# Patient Record
Sex: Male | Born: 1995 | Race: White | Hispanic: No | Marital: Single | Smoking: Never smoker
Health system: Southern US, Community
[De-identification: ages and names within clinical notes are randomized; demographics above are authoritative.]

## PROBLEM LIST (undated history)

## (undated) DIAGNOSIS — F32A Depression, unspecified: Secondary | ICD-10-CM

## (undated) HISTORY — PX: WISDOM TOOTH EXTRACTION: SHX21

## (undated) HISTORY — DX: Depression, unspecified: F32.A

---
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
## 2020-07-29 ENCOUNTER — Telehealth: Payer: Self-pay

## 2020-07-29 NOTE — Telephone Encounter (Signed)
Patient is grandson of mittie and ronald hinshaw and would like to be taken on as new patient Please advise Thank you

## 2020-07-29 NOTE — Telephone Encounter (Signed)
That is fine schedule him.

## 2020-08-03 ENCOUNTER — Encounter (HOSPITAL_COMMUNITY): Payer: Self-pay

## 2020-08-03 ENCOUNTER — Emergency Department (HOSPITAL_COMMUNITY)
Admission: EM | Admit: 2020-08-03 | Discharge: 2020-08-03 | Disposition: A | Discharge status: Home / Self Care | Payer: BC Managed Care – PPO | Attending: Emergency Medicine | Admitting: Emergency Medicine

## 2020-08-03 ENCOUNTER — Emergency Department (HOSPITAL_COMMUNITY): Payer: BC Managed Care – PPO

## 2020-08-03 ENCOUNTER — Other Ambulatory Visit: Payer: Self-pay

## 2020-08-03 DIAGNOSIS — R109 Unspecified abdominal pain: Secondary | ICD-10-CM

## 2020-08-03 DIAGNOSIS — R1033 Periumbilical pain: Secondary | ICD-10-CM | POA: Diagnosis not present

## 2020-08-03 LAB — COMPREHENSIVE METABOLIC PANEL
ALT: 18 U/L (ref 0–44)
AST: 26 U/L (ref 15–41)
Albumin: 4.3 g/dL (ref 3.5–5.0)
Alkaline Phosphatase: 73 U/L (ref 38–126)
Anion gap: 8 (ref 5–15)
BUN: 10 mg/dL (ref 6–20)
CO2: 23 mmol/L (ref 22–32)
Calcium: 9.3 mg/dL (ref 8.9–10.3)
Chloride: 105 mmol/L (ref 98–111)
Creatinine, Ser: 0.9 mg/dL (ref 0.61–1.24)
GFR, Estimated: >60 mL/min (ref >60)
Glucose, Bld: 80 mg/dL (ref 70–99)
Potassium: 5.4 mmol/L — ABNORMAL HIGH (ref 3.5–5.1)
Sodium: 136 mmol/L (ref 135–145)
Total Bilirubin: 1.4 mg/dL — ABNORMAL HIGH (ref 0.3–1.2)
Total Protein: 7.3 g/dL (ref 6.5–8.1)

## 2020-08-03 LAB — CBC WITH DIFFERENTIAL/PLATELET
Abs Immature Granulocytes: 0.02 10*3/uL (ref 0.00–0.07)
Basophils Absolute: 0 10*3/uL (ref 0.0–0.1)
Basophils Relative: 1 %
Eosinophils Absolute: 0.1 10*3/uL (ref 0.0–0.5)
Eosinophils Relative: 1 %
HCT: 49.1 % (ref 39.0–52.0)
Hemoglobin: 16.7 g/dL (ref 13.0–17.0)
Immature Granulocytes: 0 %
Lymphocytes Relative: 21 %
Lymphs Abs: 1.4 10*3/uL (ref 0.7–4.0)
MCH: 30.3 pg (ref 26.0–34.0)
MCHC: 34 g/dL (ref 30.0–36.0)
MCV: 88.9 fL (ref 80.0–100.0)
Monocytes Absolute: 0.4 10*3/uL (ref 0.1–1.0)
Monocytes Relative: 6 %
Neutro Abs: 4.6 10*3/uL (ref 1.7–7.7)
Neutrophils Relative %: 71 %
Platelets: 191 10*3/uL (ref 150–400)
RBC: 5.52 MIL/uL (ref 4.22–5.81)
RDW: 12.9 % (ref 11.5–15.5)
WBC: 6.6 10*3/uL (ref 4.0–10.5)
nRBC: 0 % (ref 0.0–0.2)

## 2020-08-03 LAB — URINALYSIS, ROUTINE W REFLEX MICROSCOPIC
Bilirubin Urine: NEGATIVE
Glucose, UA: NEGATIVE mg/dL
Hgb urine dipstick: NEGATIVE
Ketones, ur: NEGATIVE mg/dL
Leukocytes,Ua: NEGATIVE
Nitrite: NEGATIVE
Protein, ur: NEGATIVE mg/dL
Specific Gravity, Urine: 1.005 (ref 1.005–1.030)
pH: 6 (ref 5.0–8.0)

## 2020-08-03 LAB — LIPASE, BLOOD: Lipase: 30 U/L (ref 11–51)

## 2020-08-03 NOTE — ED Provider Notes (Signed)
[facility]-EMERGENCY DEPT Provider Note   CSN: [id number] Arrival date & time: 08/03/20  1401     History Chief Complaint  Patient presents with  . Abdominal Pain    Jesse Bush is a 25 y.o. male.  25 year old male who presents with abdominal pain and flank pain.  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  Last week, approximately 5 days ago, he noticed a small tender mass under his umbilicus which he thought was a hernia.  He has continued to have intermittent pain in this area but then later developed left flank pain and now the left flank pain has been persistent and more severe.  He cannot get comfortable and has had difficulty sleeping at night due to the pain.  He has had decreased appetite and nausea, no vomiting or diarrhea.  Normal bowel movements.  No urinary symptoms, fevers, or recent illness. No hx of abd surgery.   The history is provided by the patient.  Abdominal Pain      History reviewed. No pertinent past medical history.  There are no problems to display for this patient.   History reviewed. No pertinent surgical history.     History reviewed. No pertinent family history.  Social History   Tobacco Use  . Smoking status: Never Smoker  . Smokeless tobacco: Current User    Home Medications Prior to Admission medications   Not on File    Allergies    Patient has no known allergies.  Review of Systems   Review of Systems  Gastrointestinal: Positive for abdominal pain.   All other systems reviewed and are negative except that which was mentioned in HPI  Physical Exam Updated Vital Signs BP 138/73   Pulse 80   Temp 98.5 F (36.9 C) (Oral)   Resp 20   Ht 5\' 10"  (1.778 m)   Wt 95.3 kg   SpO2 99%   BMI 30.13 kg/m   Physical Exam Constitutional:      General: He is not in acute distress.    Appearance: Normal appearance.  HENT:     Head: Normocephalic and  atraumatic.  Eyes:     Conjunctiva/sclera: Conjunctivae normal.  Cardiovascular:     Rate and Rhythm: Normal rate and regular rhythm.     Heart sounds: Normal heart sounds. No murmur heard.   Pulmonary:     Effort: Pulmonary effort is normal.     Breath sounds: Normal breath sounds.  Abdominal:     General: Abdomen is flat. Bowel sounds are normal. There is no distension.     Palpations: Abdomen is soft.     Tenderness: There is abdominal tenderness.     Comments: Pea-sized mass palpable just below umbilicus, tender to palpation; tenderness in RLQ, no rebound or guarding  Musculoskeletal:     Right lower leg: No edema.     Left lower leg: No edema.  Skin:    General: Skin is warm and dry.  Neurological:     Mental Status: He is alert and oriented to person, place, and time.     Comments: fluent  Psychiatric:        Mood and Affect: Mood normal.        Behavior: Behavior normal.     ED Results / Procedures / Treatments   Labs (all labs ordered are listed, but only abnormal results are displayed) Labs Reviewed  COMPREHENSIVE METABOLIC PANEL - Abnormal; Notable for  the following components:      Result Value   Potassium 5.4 (*)    Total Bilirubin 1.4 (*)    All other components within normal limits  URINALYSIS, ROUTINE W REFLEX MICROSCOPIC - Abnormal; Notable for the following components:   Color, Urine STRAW (*)    All other components within normal limits  CBC WITH DIFFERENTIAL/PLATELET  LIPASE, BLOOD    EKG None  Radiology CT Renal Stone Study  Result Date: 08/03/2020 CLINICAL DATA:  Left flank pain back pain EXAM: CT ABDOMEN AND PELVIS WITHOUT CONTRAST TECHNIQUE: Multidetector CT imaging of the abdomen and pelvis was performed following the standard protocol without IV contrast. COMPARISON:  None. FINDINGS: Lower chest: Lung bases demonstrate no acute consolidation or effusion. Normal cardiac size. Hepatobiliary: No focal liver abnormality is seen. No gallstones,  gallbladder wall thickening, or biliary dilatation. Pancreas: Unremarkable. No pancreatic ductal dilatation or surrounding inflammatory changes. Spleen: Normal in size without focal abnormality. Adrenals/Urinary Tract: Adrenal glands are unremarkable. Kidneys are normal, without renal calculi, focal lesion, or hydronephrosis. Bladder is unremarkable. Stomach/Bowel: Stomach is within normal limits. Appendix appears normal. No evidence of bowel wall thickening, distention, or inflammatory changes. Vascular/Lymphatic: No significant vascular findings are present. No enlarged abdominal or pelvic lymph nodes. Reproductive: Prostate is unremarkable. Other: No abdominal wall hernia or abnormality. No abdominopelvic ascites. Musculoskeletal: No acute or significant osseous findings. IMPRESSION: Negative. No CT evidence for acute intra-abdominal or pelvic abnormality. Electronically Signed   By: Jasmine Pang M.D.   On: 08/03/2020 15:57    Procedures Procedures   Medications Ordered in ED Medications - No data to display  ED Course  I have reviewed the triage vital signs and the nursing notes.  Pertinent labs & imaging results that were available during my care of the patient were reviewed by me and considered in my medical decision making (see chart for details).    MDM Rules/Calculators/A&P                          Pt w/ RLQ tenderness and tenderness just below umbilicus with pea-sized mass, not clearly a hernia but incarcerated hernia on differential, along with kidney stone or appendicitis. Recommended CT for evaluation.   LAbs show normal LFTs and lipase, K 5.4 but suspect hemolysis as creatinine normal and pt not taking any medications. UA normal. Normal CBC.  CT shows no acute findings to explain the patient's symptoms, specifically no renal pathology or abdominal process.  I discussed imaging results with the patient and regarding his back pain recommended trial of NSAID course.  Regarding the  placed on his abdomen, recommended keeping close watch on symptoms and following up with PCP as he may need referral to general surgery if the area becomes enlarged.  No evidence of hernia or mass on CT.  Discussed abortive measures for symptoms and reviewed return precautions.  He voiced understanding. Final Clinical Impression(s) / ED Diagnoses Final diagnoses:  Periumbilical abdominal pain  Left flank pain    Rx / DC Orders ED Discharge Orders    None       Demiana Crumbley, Ambrose Finland, MD 08/03/20 1913

## 2020-08-03 NOTE — ED Notes (Signed)
Called lab to check on CBC.

## 2020-08-03 NOTE — ED Triage Notes (Addendum)
Pt arrived via walk in, c/o abd pain. Believes it is a hernia. Pain radiating to back and flank x3 days. Denies any vomiting or diarrhea, or urinary issues. Endorses nausea.

## 2020-08-04 NOTE — Telephone Encounter (Signed)
LVM for patient to call back to schedule

## 2021-01-13 NOTE — Progress Notes (Signed)
Phone: [phone]   Subjective:  Patient presents today to establish care.  Prior patient of pediatrician.  Chief Complaint  Patient presents with   Establish Care   Fatigue    Pt is c/o fatigue and diarrhea started yesterday morning. He went to [facility] for [facility] test this morning results are not back yet. He was at a party over the weekend.   Joint Pain    See problem oriented charting  The following were reviewed and entered/updated in epic: Past Medical History:  Diagnosis Date   Depression    not clinically diagnosed but has assumed based off of family history- mom and grandad   Patient Active Problem List   Diagnosis Date Noted   Depression 01/17/2021   Past Surgical History:  Procedure Laterality Date   WISDOM TOOTH EXTRACTION     no sedation    Family History  Problem Relation Age of Onset   Depression Mother    Rheum arthritis Mother    Other Father        does not know his medical history   Autism Sister    Depression Maternal Grandfather    Cancer Maternal Grandfather        bladder   Other Paternal Grandmother        covid in 2021   Healthy Paternal Grandfather        age 31 in 2022    Medications- reviewed and updated Current Outpatient Medications  Medication Sig Dispense Refill   ibuprofen (ADVIL) 200 MG tablet Take 400 mg by mouth every 6 (six) hours as needed.     Multiple Vitamin (MULTIVITAMIN) tablet Take 1 tablet by mouth daily.     No current facility-administered medications for this visit.    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       Works at [employer]- part time evening work- meets his financial need   - [employer], [employer] finished first year and [employer] for half semester- music major      Hobbies: not doing music right now, play video games, listen to music      2022-  single not dating/sexual active    Objective  Objective:  BP 110/70 (BP Location: Left Arm, Patient Position: Sitting, Cuff Size: Normal)   Pulse 61   Temp (!) 97.4 F (36.3 C) (Temporal)   Ht 5\' 10"  (1.778 m)   Wt 196 lb 6.1 oz (89.1 kg)   SpO2 98%   BMI 28.18 kg/m  Gen: NAD, resting comfortably HEENT: Mucous membranes are moist. Oropharynx normal. TM normal. Eyes: sclera and lids normal, PERRLA Neck: no thyromegaly, no cervical lymphadenopathy CV: RRR no murmurs rubs or gallops Lungs: CTAB no crackles, wheeze, rhonchi Abdomen: soft/nontender except for mild pain with palpation of lower abdomen (makes him feel like needs BM) /nondistended/normal bowel sounds. No rebound or guarding.  Ext: no edema Skin: warm, dry Neuro: 5/5 strength in upper and lower extremities, normal gait, normal reflexes Small papule or pustule just below glans of penis about 1 mm- 2 even smaller similar lesions just adjacent   Assessment and Plan:   #Fatigue/diarrhea/vomiting S: Patient reports fatigue and diarrhea that started yesterday morning. Vomiting x5-6 since yesterday morning. No blood or black in emesis or in stool. Last episode was 10 or 11 am yesterday. In last 24 hours 10  stools.  He just [facility]-pending results.  He did party over the weekend and is wondering if could be hung over. Trying to drink a lot of water to keep up with losses. Has spent a lot of time in bed. Halloween theme on Sunday night.  - no ibuprofen in last 48 hours- advised to stay off.  - no fever. Mildly winded at times. Not coughing.  A/P: We discussed for fatigue/diarrhea/vomiting if symptoms fail to resolve within 72 hours I would recommend stool sample/testing.  Could consider short-term and if in the next 24 hours does not begin to have significant reduction in diarrhea.  He should stay well-hydrated-discussed mixing perhaps 1 water than 1 Gatorade to help with electrolytes as well.  Would  recommend a brat type diet  #Joint pain S:patient with joint pain since early teenage years. Sometimes with standing has to support himself with his arms. Can be hard working at [employer]- apparently several other coworkers have joint pain as well due to type of work. Has been at [employer] for a year.   Wrists are most painful joint. Knees sometimes as well. Elbows and neck also particularly bothersome.    Joint stiffness in AM- under 5 minutes though- pops back and arms and feels better.  A/P: Offered referral to sports medicine or orthopedics but he would like to hold off for now- will reach out if changes mind     # Depression S: Medication:none  - feels like sleep scheudle affects him- stays up late to 2-3 AM and not tired. Wakes up late and feels like doesn't get much done in  Depression screen PHQ 2/9 01/17/2021  Decreased Interest 3  Down, Depressed, Hopeless 1  PHQ - 2 Score 4  Altered sleeping 3  Tired, decreased energy 2  Change in appetite 1  Feeling bad or failure about yourself  0  Trouble concentrating 0  Moving slowly or fidgety/restless 0  Suicidal thoughts 0  PHQ-9 Score 10  Difficult doing work/chores Somewhat difficult   A/P: Mild to moderate depression based off of PHQ-9 scores-he is open to considering therapy-gave information about [facility].  I also think altering bed schedule/wake-up time and getting involved in the community or starting exercising would be beneficial.   # ED visit for abdominal pain  S:Patient was seen on 08/03/20 for an evaluation of abdominal pain and flank pain. The week before visit, he noticed a small tender mass under his umbilicus which he thought was a hernia. He continued to have intermittent pain in this area however later left flank pain developed and after flank pain was more persistent and more severe. He couldn't get comfortable and had difficulty sleeping at night due to the pain. He decreased appetite and nausea, no vomiting  or diarrhea. Normal bowel movements. No hx of abdominal surgery.  -LAbs showed normal LFTs and lipase, K 5.4 but suspect hemolysis as creatinine normal and pt not taking any medications. UA normal. Normal CBC.  CT showed no acute findings to explain the patient's symptoms, specifically no renal pathology or abdominal process.  I discussed imaging results with the patient and regarding his back pain recommended trial of NSAID course.  - patient felt smal bulge under umbilicus- has gotten smaller since that time A/P: could have mild fat containing hernia - luckly Ct was encouraging and symptoms better- continue to monitor   #Unprotected sex-patient has had unprotected sex in the past and not been tested  for STDs-he is willing to have [place] check him-due to recent COVID test we will need to have him come back for this-can schedule lab visit so future date -has lesion at base of penis he thinks could be PPP but this occurred PRIOR to sexual intercourse so not thought to be STDsince this occurred prior to intercourse and present for 6-8 years without change ok to monitor- would use protection with sex to be on safe side- if area worsens should let us know.  Also he had Gardasil before this appeared making genital warts less likely.  Also appears to be more of a papule  or pustule - not verrucous. Doubt penile cancer with no change over years   Recommended follow up: Return in about 1 year (around 01/17/2022) for physical or sooner if needed.  Time Spent: 47 minutes of total time (11:03 AM- 11:50 AM) was spent on the date of the encounter performing the following actions: chart review prior to seeing the patient, obtaining history and entering into epic, performing a medically necessary exam, counseling on the treatment plan, placing orders, and documenting in our EHR.   I,Jada Bradford,acting as a scribe for Tana Conch, MD.,have documented all relevant documentation on the behalf of Tana Conch, MD,as  directed by  Tana Conch, MD while in the presence of Tana Conch, MD.  I, Tana Conch, MD, have reviewed all documentation for this visit. The documentation on 01/17/21 for the exam, diagnosis, procedures, and orders are all accurate and complete.  Return precautions advised. Tana Conch, MD

## 2021-01-17 ENCOUNTER — Encounter: Payer: Self-pay | Admitting: Family Medicine

## 2021-01-17 ENCOUNTER — Ambulatory Visit (INDEPENDENT_AMBULATORY_CARE_PROVIDER_SITE_OTHER): Payer: BC Managed Care – PPO | Admitting: Family Medicine

## 2021-01-17 ENCOUNTER — Other Ambulatory Visit: Payer: Self-pay

## 2021-01-17 VITALS — BP 110/70 | HR 61 | Temp 97.4°F | Ht 70.0 in | Wt 196.4 lb

## 2021-01-17 DIAGNOSIS — Z1159 Encounter for screening for other viral diseases: Secondary | ICD-10-CM | POA: Diagnosis not present

## 2021-01-17 DIAGNOSIS — M2550 Pain in unspecified joint: Secondary | ICD-10-CM

## 2021-01-17 DIAGNOSIS — R5383 Other fatigue: Secondary | ICD-10-CM

## 2021-01-17 DIAGNOSIS — Z113 Encounter for screening for infections with a predominantly sexual mode of transmission: Secondary | ICD-10-CM

## 2021-01-17 DIAGNOSIS — F32A Depression, unspecified: Secondary | ICD-10-CM

## 2021-01-17 DIAGNOSIS — Z118 Encounter for screening for other infectious and parasitic diseases: Secondary | ICD-10-CM

## 2021-01-17 DIAGNOSIS — Z13220 Encounter for screening for lipoid disorders: Secondary | ICD-10-CM

## 2021-01-17 DIAGNOSIS — Z114 Encounter for screening for human immunodeficiency virus [HIV]: Secondary | ICD-10-CM

## 2021-01-17 NOTE — Patient Instructions (Addendum)
Health Maintenance Due  Topic Date Due   HPV VACCINES (1 - Male 2-dose series)- team please check [facility]  Never done   COVID-19 Vaccine - consider bivalent vaccination 11/08/2019   We discussed for fatigue/diarrhea/vomiting if symptoms fail to resolve within 72 hours I would recommend stool sample/testing.  Could consider short-term and if in the next 24 hours does not begin to have significant reduction in diarrhea.  He should stay well-hydrated-discussed mixing perhaps 1 water than 1 Gatorade to help with electrolytes as well.  Would recommend a brat type diet - needs to have negative covid test and be diarrhea free for 24 hours  Please call [phone] to schedule a visit with [facility] - please tell the office you were directly referred by Dr. Durene Cal  Schedule lab visit AFTER covid test negative and after diarrhea and vomiting gone for 24 hours  Team you can send him out the side door  Recommended follow up: Return in about 1 year (around 01/17/2022) for physical or sooner if needed.

## 2021-02-21 ENCOUNTER — Other Ambulatory Visit: Payer: Self-pay | Admitting: Family Medicine

## 2021-02-21 ENCOUNTER — Other Ambulatory Visit: Payer: Self-pay

## 2021-02-21 ENCOUNTER — Ambulatory Visit: Payer: Self-pay

## 2021-02-21 DIAGNOSIS — R0689 Other abnormalities of breathing: Secondary | ICD-10-CM

## 2022-02-06 ENCOUNTER — Encounter: Payer: BC Managed Care – PPO | Admitting: Family Medicine

## 2022-03-16 ENCOUNTER — Emergency Department (HOSPITAL_COMMUNITY)
Admission: EM | Admit: 2022-03-16 | Discharge: 2022-03-17 | Disposition: A | Discharge status: Home / Self Care | Payer: BC Managed Care – PPO | Attending: Student | Admitting: Student

## 2022-03-16 ENCOUNTER — Other Ambulatory Visit: Payer: Self-pay

## 2022-03-16 ENCOUNTER — Emergency Department (HOSPITAL_COMMUNITY): Payer: BC Managed Care – PPO

## 2022-03-16 ENCOUNTER — Encounter (HOSPITAL_COMMUNITY): Payer: Self-pay | Admitting: *Deleted

## 2022-03-16 DIAGNOSIS — R0602 Shortness of breath: Secondary | ICD-10-CM | POA: Diagnosis present

## 2022-03-16 LAB — CBC
HCT: 45.4 % (ref 39.0–52.0)
Hemoglobin: 16.4 g/dL (ref 13.0–17.0)
MCH: 31.1 pg (ref 26.0–34.0)
MCHC: 36.1 g/dL — ABNORMAL HIGH (ref 30.0–36.0)
MCV: 86.1 fL (ref 80.0–100.0)
Platelets: 338 10*3/uL (ref 150–400)
RBC: 5.27 MIL/uL (ref 4.22–5.81)
RDW: 12.1 % (ref 11.5–15.5)
WBC: 8.7 10*3/uL (ref 4.0–10.5)
nRBC: 0 % (ref 0.0–0.2)

## 2022-03-16 LAB — BASIC METABOLIC PANEL
Anion gap: 9 (ref 5–15)
BUN: 7 mg/dL (ref 6–20)
CO2: 22 mmol/L (ref 22–32)
Calcium: 9.3 mg/dL (ref 8.9–10.3)
Chloride: 106 mmol/L (ref 98–111)
Creatinine, Ser: 0.88 mg/dL (ref 0.61–1.24)
GFR, Estimated: >60 mL/min (ref >60)
Glucose, Bld: 102 mg/dL — ABNORMAL HIGH (ref 70–99)
Potassium: 3.5 mmol/L (ref 3.5–5.1)
Sodium: 137 mmol/L (ref 135–145)

## 2022-03-16 LAB — TROPONIN I (HIGH SENSITIVITY)
Troponin I (High Sensitivity): 3 ng/L (ref <18)
Troponin I (High Sensitivity): 3 ng/L (ref <18)

## 2022-03-16 NOTE — ED Notes (Signed)
Pt hyperventilating in lobby, told by pt visitor and other pt. PA and RN made aware, vitals taken.

## 2022-03-16 NOTE — ED Provider Triage Note (Signed)
Emergency Medicine Provider Triage Evaluation Note  Jesse Bush , a 26 y.o. male  was evaluated in triage.  Pt complains of chest pain and shortness of breath. States that same began a few hours ago when he was at work and has been persistent since then.  He states that he is unable to take a deep breath.  Denies any history of similar symptoms previously.  Denies any cough or congestion.  Denies any cardiac history.  Denies any history of cocaine or methamphetamine abuse.  Patient does vape.  Review of Systems  Positive: Negative:   Physical Exam  BP (!) 141/98 (BP Location: Right Arm)   Pulse 76   Temp 99.1 F (37.3 C)   Resp 16   Ht 5\' 10"  (1.778 m)   Wt 98.1 kg   SpO2 97%   BMI 31.03 kg/m  Gen:   Awake, no distress   Resp:  Normal effort  MSK:   Moves extremities without difficulty  Other:    Medical Decision Making  Medically screening exam initiated at 6:29 PM.  Appropriate orders placed.  Jesse Bush was informed that the remainder of the evaluation will be completed by another provider, this initial triage assessment does not replace that evaluation, and the importance of remaining in the ED until their evaluation is complete.     Alfonse Spruce, PA-C 03/16/22 1831

## 2022-03-16 NOTE — ED Triage Notes (Signed)
The pt is c/o sob and chest pain he was hyperventilating while walking across the room  he reports that he has mild anxiety no acute distress

## 2022-03-17 NOTE — ED Notes (Signed)
Patient walked out of ER at this time prior to discharge. MD notified

## 2022-03-17 NOTE — ED Provider Notes (Signed)
[facility] EMERGENCY DEPARTMENT Provider Note   CSN: [id number] Arrival date & time: 03/16/22  1804     History  Chief Complaint  Patient presents with   Shortness of Breath    Jesse Bush is a 26 y.o. male.  26 year old male presents to the emergency department for evaluation of shortness of breath.  He states that he was at work 1 year ago when chemical spilled on the floor and he inhaled the fumes.  Since this time, he has had sporadic episodes of shortness of breath which are associated with progression to full "body numbness" as well as lightheadedness and near syncope.  He has had a left-sided chest discomfort today which is worse when applying pressure to his left chest wall.  Notes some hemoptysis mixed with phlegm on occasion. He has been to an urgent care as well as a primary care doctor for the symptoms without formal diagnosis.  Denies fever, cough, congestion, history of ACS.  No known family history of sudden cardiac death at a young age.  He has tried to decrease his caffeine intake without symptomatic relief.  Does continue to vape.  Denies illicit drug or stimulant use.  No recent surgeries or hospitalizations, prolonged travel, known PMH or FHx of VTE.  The history is provided by the patient. No language interpreter was used.  Shortness of Breath      Home Medications Prior to Admission medications   Medication Sig Start Date End Date Taking? Authorizing Provider  ibuprofen (ADVIL) 200 MG tablet Take 400 mg by mouth every 6 (six) hours as needed.    [provider]  Multiple Vitamin (MULTIVITAMIN) tablet Take 1 tablet by mouth daily.    [provider]      Allergies    Patient has no known allergies.    Review of Systems   Review of Systems  Respiratory:  Positive for shortness of breath.   Ten systems reviewed and are negative for acute change, except as noted in the HPI.    Physical Exam Updated Vital Signs BP  133/80   Pulse (!) 56   Temp 98.3 F (36.8 C) (Oral)   Resp 18   Ht 5\' 10"  (1.778 m)   Wt 98.1 kg   SpO2 100%   BMI 31.03 kg/m   Physical Exam Vitals and nursing note reviewed.  Constitutional:      General: He is not in acute distress.    Appearance: He is well-developed. He is not diaphoretic.     Comments: Nontoxic appearing and in NAD  HENT:     Head: Normocephalic and atraumatic.  Eyes:     General: No scleral icterus.    Conjunctiva/sclera: Conjunctivae normal.  Cardiovascular:     Rate and Rhythm: Normal rate and regular rhythm.     Pulses: Normal pulses.  Pulmonary:     Effort: Pulmonary effort is normal. No respiratory distress.     Breath sounds: No stridor. No wheezing or rales.     Comments: Lungs CTAB. Respirations even and unlabored. Musculoskeletal:        General: Normal range of motion.     Cervical back: Normal range of motion.  Skin:    General: Skin is warm and dry.     Coloration: Skin is not pale.     Findings: No erythema or rash.  Neurological:     Mental Status: He is alert and oriented to person, place, and time.     Coordination:  Coordination normal.  Psychiatric:        Behavior: Behavior normal.     ED Results / Procedures / Treatments   Labs (all labs ordered are listed, but only abnormal results are displayed) Labs Reviewed  BASIC METABOLIC PANEL - Abnormal; Notable for the following components:      Result Value   Glucose, Bld 102 (*)    All other components within normal limits  CBC - Abnormal; Notable for the following components:   MCHC 36.1 (*)    All other components within normal limits  TROPONIN I (HIGH SENSITIVITY)  TROPONIN I (HIGH SENSITIVITY)    EKG None  Radiology DG Chest 2 View  Result Date: 03/16/2022 CLINICAL DATA:  Chest pain EXAM: CHEST - 2 VIEW COMPARISON:  02/21/2021 FINDINGS: The heart size and mediastinal contours are within normal limits. Both lungs are clear. Lateral view is less than optimal due  to motion artifacts. The visualized skeletal structures are unremarkable. IMPRESSION: No active cardiopulmonary disease. Electronically Signed   By: Ernie Avena M.D.   On: 03/16/2022 19:29    Procedures Procedures    Medications Ordered in ED Medications - No data to display  ED Course/ Medical Decision Making/ A&P Clinical Course as of 03/17/22 0103  Sat Mar 17, 2022  0037 Notified by RN that patient eloped from the ED. [KH]    Clinical Course User Index [KH] Antony Madura, PA-C                           Medical Decision Making Amount and/or Complexity of Data Reviewed Labs: ordered.   This patient presents to the ED for concern of shortness of breath, this involves an extensive number of treatment options, and is a complaint that carries with it a high risk of complications and morbidity.  The differential diagnosis includes ACS vs myocarditis vs pleural effusion vs PTX vs PNA vs anxiety attack   Co morbidities that complicate the patient evaluation  Depression Anxiety    Additional history obtained:  Additional history obtained from family at bedside External records from outside source obtained and reviewed including prior CXR in December 2022 which was negative for acute process.   Lab Tests:  I Ordered, and personally interpreted labs.  The pertinent results include:  Negative Troponin x 2. No electrolyte derangements. Normal CBC and BMP.   Imaging Studies ordered:  I ordered imaging studies including CXR  I independently visualized and interpreted imaging which showed no acute cardiopulmonary abnormality I agree with the radiologist interpretation   Cardiac Monitoring:  The patient was maintained on a cardiac monitor.  I personally viewed and interpreted the cardiac monitored which showed an underlying rhythm of: NSR   Medicines ordered and prescription drug management:  I have reviewed the patients home medicines and have made adjustments as  needed   Test Considered:  D dimer - patient eloped prior to completion   Problem List / ED Course:  Low suspicion for emergent cardiac etiology given reassuring workup today and symptom chronicity.  EKG is nonischemic and troponin negative x2.   Chest x-ray without evidence of mediastinal widening to suggest dissection.  No pneumothorax, pneumonia, pleural effusion.   Pulmonary embolus further considered; however, patient without tachycardia, tachypnea, dyspnea, hypoxia. Notified by RN that patient eloped from the department prior to D dimer completion.   Social Determinants of Health:  Insured patient   Dispostion:  Disposition set to eloped. Patient seen departing  the ED in stable condition.         Final Clinical Impression(s) / ED Diagnoses Final diagnoses:  SOB (shortness of breath)    Rx / DC Orders ED Discharge Orders     None         Antony Madura, PA-C 03/17/22 0105    Glendora Score, MD 03/17/22 [phone]

## 2022-03-19 ENCOUNTER — Observation Stay (HOSPITAL_BASED_OUTPATIENT_CLINIC_OR_DEPARTMENT_OTHER)
Admission: EM | Admit: 2022-03-19 | Discharge: 2022-03-20 | Disposition: A | Discharge status: Home / Self Care | Payer: BC Managed Care – PPO | Attending: Surgery | Admitting: Surgery

## 2022-03-19 ENCOUNTER — Other Ambulatory Visit: Payer: Self-pay

## 2022-03-19 ENCOUNTER — Encounter (HOSPITAL_BASED_OUTPATIENT_CLINIC_OR_DEPARTMENT_OTHER): Payer: Self-pay | Admitting: *Deleted

## 2022-03-19 ENCOUNTER — Emergency Department (HOSPITAL_BASED_OUTPATIENT_CLINIC_OR_DEPARTMENT_OTHER): Payer: BC Managed Care – PPO

## 2022-03-19 DIAGNOSIS — R109 Unspecified abdominal pain: Secondary | ICD-10-CM | POA: Diagnosis present

## 2022-03-19 DIAGNOSIS — R1084 Generalized abdominal pain: Principal | ICD-10-CM

## 2022-03-19 DIAGNOSIS — K3580 Unspecified acute appendicitis: Secondary | ICD-10-CM | POA: Diagnosis not present

## 2022-03-19 LAB — COMPREHENSIVE METABOLIC PANEL
ALT: 29 U/L (ref 0–44)
AST: 22 U/L (ref 15–41)
Albumin: 5.1 g/dL — ABNORMAL HIGH (ref 3.5–5.0)
Alkaline Phosphatase: 76 U/L (ref 38–126)
Anion gap: 14 (ref 5–15)
BUN: 8 mg/dL (ref 6–20)
CO2: 24 mmol/L (ref 22–32)
Calcium: 10 mg/dL (ref 8.9–10.3)
Chloride: 101 mmol/L (ref 98–111)
Creatinine, Ser: 0.87 mg/dL (ref 0.61–1.24)
GFR, Estimated: >60 mL/min (ref >60)
Glucose, Bld: 79 mg/dL (ref 70–99)
Potassium: 3.9 mmol/L (ref 3.5–5.1)
Sodium: 139 mmol/L (ref 135–145)
Total Bilirubin: 1.3 mg/dL — ABNORMAL HIGH (ref 0.3–1.2)
Total Protein: 8.5 g/dL — ABNORMAL HIGH (ref 6.5–8.1)

## 2022-03-19 LAB — CBC
HCT: 49.8 % (ref 39.0–52.0)
HCT: 47.7 % (ref 39.0–52.0)
Hemoglobin: 17.4 g/dL — ABNORMAL HIGH (ref 13.0–17.0)
Hemoglobin: 16 g/dL (ref 13.0–17.0)
MCH: 30.1 pg (ref 26.0–34.0)
MCH: 31.1 pg (ref 26.0–34.0)
MCHC: 34.9 g/dL (ref 30.0–36.0)
MCV: 86.2 fL (ref 80.0–100.0)
Platelets: 376 10*3/uL (ref 150–400)
RBC: 5.78 MIL/uL (ref 4.22–5.81)
RBC: 5.14 MIL/uL (ref 4.22–5.81)
RDW: 12.2 % (ref 11.5–15.5)
WBC: 8.1 10*3/uL (ref 4.0–10.5)
nRBC: 0 % (ref 0.0–0.2)
nRBC: 0 % (ref 0.0–0.2)

## 2022-03-19 LAB — URINALYSIS, ROUTINE W REFLEX MICROSCOPIC
Bacteria, UA: NONE SEEN
Bilirubin Urine: NEGATIVE
Glucose, UA: NEGATIVE mg/dL
Ketones, ur: 40 mg/dL — AB
Leukocytes,Ua: NEGATIVE
Nitrite: NEGATIVE
Protein, ur: NEGATIVE mg/dL
Specific Gravity, Urine: 1.011 (ref 1.005–1.030)
pH: 6 (ref 5.0–8.0)

## 2022-03-19 LAB — HIV ANTIBODY (ROUTINE TESTING W REFLEX): HIV Screen 4th Generation wRfx: NONREACTIVE

## 2022-03-19 LAB — LIPASE, BLOOD: Lipase: 16 U/L (ref 11–51)

## 2022-03-19 MED ORDER — OXYCODONE HCL 5 MG PO TABS: 5.0000 mg | ORAL_TABLET | ORAL | Status: DC | PRN

## 2022-03-19 MED ORDER — DIPHENHYDRAMINE HCL 25 MG PO CAPS: 25.0000 mg | ORAL_CAPSULE | Freq: Four times a day (QID) | ORAL | Status: DC | PRN

## 2022-03-19 MED ORDER — LACTATED RINGERS IV BOLUS
1000.0000 mL | Freq: Once | INTRAVENOUS | Status: AC
  Administered 2022-03-19: 1000 mL via INTRAVENOUS

## 2022-03-19 MED ORDER — SODIUM CHLORIDE 0.9 % IV SOLN
2.0000 g | INTRAVENOUS | Status: DC
  Administered 2022-03-19: 2 g via INTRAVENOUS
  Filled 2022-03-19: qty 20

## 2022-03-19 MED ORDER — ACETAMINOPHEN 650 MG RE SUPP: 650.0000 mg | Freq: Four times a day (QID) | RECTAL | Status: DC | PRN

## 2022-03-19 MED ORDER — MORPHINE SULFATE (PF) 2 MG/ML IV SOLN
2.0000 mg | INTRAVENOUS | Status: DC | PRN
  Administered 2022-03-20: 2 mg via INTRAVENOUS
  Filled 2022-03-19: qty 1

## 2022-03-19 MED ORDER — ACETAMINOPHEN 325 MG PO TABS: 650.0000 mg | ORAL_TABLET | Freq: Four times a day (QID) | ORAL | Status: DC | PRN

## 2022-03-19 MED ORDER — FAMOTIDINE 20 MG PO TABS
20.0000 mg | ORAL_TABLET | Freq: Once | ORAL | Status: AC
  Administered 2022-03-19: 20 mg via ORAL
  Filled 2022-03-19: qty 1

## 2022-03-19 MED ORDER — DIPHENHYDRAMINE HCL 50 MG/ML IJ SOLN: 25.0000 mg | Freq: Four times a day (QID) | INTRAMUSCULAR | Status: DC | PRN

## 2022-03-19 MED ORDER — ENOXAPARIN SODIUM 40 MG/0.4ML IJ SOSY
40.0000 mg | PREFILLED_SYRINGE | INTRAMUSCULAR | Status: DC
  Administered 2022-03-19: 40 mg via SUBCUTANEOUS
  Filled 2022-03-19: qty 0.4

## 2022-03-19 MED ORDER — ONDANSETRON 4 MG PO TBDP: 4.0000 mg | ORAL_TABLET | Freq: Four times a day (QID) | ORAL | Status: DC | PRN

## 2022-03-19 MED ORDER — ONDANSETRON HCL 4 MG/2ML IJ SOLN: 4.0000 mg | Freq: Four times a day (QID) | INTRAMUSCULAR | Status: DC | PRN

## 2022-03-19 MED ORDER — DEXTROSE-NACL 5-0.45 % IV SOLN: INTRAVENOUS | Status: DC

## 2022-03-19 MED ORDER — METRONIDAZOLE 500 MG/100ML IV SOLN
500.0000 mg | Freq: Two times a day (BID) | INTRAVENOUS | Status: DC
  Administered 2022-03-20 (×2): 500 mg via INTRAVENOUS
  Filled 2022-03-19 (×2): qty 100

## 2022-03-19 MED ORDER — METOPROLOL TARTRATE 5 MG/5ML IV SOLN: 5.0000 mg | Freq: Four times a day (QID) | INTRAVENOUS | Status: DC | PRN

## 2022-03-19 MED ORDER — IOHEXOL 300 MG/ML  SOLN
100.0000 mL | Freq: Once | INTRAMUSCULAR | Status: AC | PRN
  Administered 2022-03-19: 100 mL via INTRAVENOUS

## 2022-03-19 MED ORDER — ONDANSETRON HCL 4 MG/2ML IJ SOLN
4.0000 mg | Freq: Once | INTRAMUSCULAR | Status: AC
  Administered 2022-03-19: 4 mg via INTRAVENOUS
  Filled 2022-03-19: qty 2

## 2022-03-19 NOTE — ED Triage Notes (Signed)
Pt is here for left upper abdominal pain x1 week and it is intermittent.  Pt has had nausea and vomiting with this.  Pt has also had diarrhea.  No fever.

## 2022-03-19 NOTE — ED Notes (Signed)
Report given to Charge RN at [facility] ED.

## 2022-03-19 NOTE — ED Provider Notes (Signed)
[facility] EMERGENCY DEPT Provider Note   CSN: [id number] Arrival date & time: 03/19/22  1259     History Chief Complaint  Patient presents with   Abdominal Pain    HPI Jesse Bush is a 27 y.o. male presenting for abdominal pain.  Initially, he reported 2 months of intermittent abdominal pain but states that over the last 48 hours it has gotten acutely worsened.  It is diffuse in nature.  He states that it started periumbilically but now seems to radiate to his left lower quadrant. Endorses subjective fevers and chills, intermittent diarrhea, anorexia over the last 24 hours with no p.o. intake. Patient's recorded medical, surgical, social, medication list and allergies were reviewed in the Snapshot window as part of the initial history.   Review of Systems   Review of Systems  Constitutional:  Negative for chills and fever.  HENT:  Negative for ear pain and sore throat.   Eyes:  Negative for pain and visual disturbance.  Respiratory:  Negative for cough and shortness of breath.   Cardiovascular:  Negative for chest pain and palpitations.  Gastrointestinal:  Positive for abdominal pain. Negative for vomiting.  Genitourinary:  Negative for dysuria and hematuria.  Musculoskeletal:  Negative for arthralgias and back pain.  Skin:  Negative for color change and rash.  Neurological:  Negative for seizures and syncope.  All other systems reviewed and are negative.   Physical Exam Updated Vital Signs BP 131/78 (BP Location: Right Arm)   Pulse 82   Temp 98.4 F (36.9 C)   Resp 16   Wt 100.7 kg   SpO2 100%   BMI 31.84 kg/m  Physical Exam Vitals and nursing note reviewed.  Constitutional:      General: He is not in acute distress.    Appearance: He is well-developed.  HENT:     Head: Normocephalic and atraumatic.  Eyes:     Conjunctiva/sclera: Conjunctivae normal.  Cardiovascular:     Rate and Rhythm: Normal rate and regular rhythm.     Heart sounds: No  murmur heard. Pulmonary:     Effort: Pulmonary effort is normal. No respiratory distress.     Breath sounds: Normal breath sounds.  Abdominal:     Palpations: Abdomen is soft.     Tenderness: There is generalized abdominal tenderness and tenderness in the epigastric area. There is no right CVA tenderness or left CVA tenderness.  Musculoskeletal:        General: No swelling.     Cervical back: Neck supple.  Skin:    General: Skin is warm and dry.     Capillary Refill: Capillary refill takes less than 2 seconds.  Neurological:     Mental Status: He is alert.  Psychiatric:        Mood and Affect: Mood normal.      ED Course/ Medical Decision Making/ A&P    Procedures Procedures   Medications Ordered in ED Medications  ondansetron (ZOFRAN) injection 4 mg (4 mg Intravenous Given 03/19/22 1554)  lactated ringers bolus 1,000 mL (1,000 mLs Intravenous New Bag/Given 03/19/22 1645)  famotidine (PEPCID) tablet 20 mg (20 mg Oral Given 03/19/22 1553)  iohexol (OMNIPAQUE) 300 MG/ML solution 100 mL (100 mLs Intravenous Contrast Given 03/19/22 1605)   Medical Decision Making:   Jesse Bush is a 27 y.o. male who presented to the ED today with abdominal pain, detailed above.    Patient's presentation is complicated by their history of RUQ pain.  Patient placed on continuous  vitals and telemetry monitoring while in ED which was reviewed periodically.  Complete initial physical exam performed, notably the patient  was HDS in NAD.     Reviewed and confirmed nursing documentation for past medical history, family history, social history.    Initial Assessment:   With the patient's presentation of abdominal pain, most likely diagnosis is nonspecific vs MSK etiology. Other diagnoses were considered including (but not limited to) gastroenteritis, colitis, small bowel obstruction, appendicitis, cholecystitis, pancreatitis, nephrolithiasis, UTI, pyleonephritis, testicular  torsion. These are considered  less likely due to history of present illness and physical exam findings.   This is most consistent with an acute life/limb threatening illness complicated by underlying chronic conditions.   Initial Plan:  CBC/CMP to evaluate for underlying infectious/metabolic etiology for patient's abdominal pain  Lipase to evaluate for pancreatitis  EKG to evaluate for cardiac source of pain  CTAB/Pelvis with contrast to evaluate for structural/surgical etiology of patients' severe abdominal pain.  Urinalysis and repeat physical assessment to evaluate for UTI/Pyelonpehritis  Empiric management of symptoms with escalating pain control and antiemetics as needed.   Initial Study Results:   Laboratory  All laboratory results reviewed without evidence of clinically relevant pathology.    Radiology All images reviewed independently. Agree with radiology report at this time.   CT ABDOMEN PELVIS W CONTRAST  Result Date: 03/19/2022 CLINICAL DATA:  Intermittent left upper abdominal pain x1 week. EXAM: CT ABDOMEN AND PELVIS WITH CONTRAST TECHNIQUE: Multidetector CT imaging of the abdomen and pelvis was performed using the standard protocol following bolus administration of intravenous contrast. RADIATION DOSE REDUCTION: This exam was performed according to the departmental dose-optimization program which includes automated exposure control, adjustment of the mA and/or kV according to patient size and/or use of iterative reconstruction technique. CONTRAST:  OMNIPAQUE IOHEXOL 300 MG/ML  SOLN COMPARISON:  CT Aug 03, 2020 FINDINGS: Lower chest: No acute abnormality. Hepatobiliary: No suspicious hepatic lesion. Gallbladder is unremarkable. No biliary ductal dilation. Pancreas: No pancreatic ductal dilation or evidence of acute inflammation. Spleen: No splenomegaly. Adrenals/Urinary Tract: Bilateral adrenal glands appear normal. No hydronephrosis. Kidneys demonstrate symmetric enhancement. Mild wall thickening of an  incompletely distended urinary bladder. Stomach/Bowel: Stomach is unremarkable for degree of distension. No pathologic dilation of small or large bowel. Prominent fluid-filled appendix measures 7 mm in diameter on image 73/2. Colon is predominately decompressed limiting evaluation. Vascular/Lymphatic: Normal caliber abdominal aorta. No pathologically enlarged abdominal or pelvic lymph nodes. Reproductive: Prostate is unremarkable. Other: No significant abdominopelvic free fluid. Musculoskeletal: No acute or significant osseous findings. IMPRESSION: 1. Prominent fluid-filled appendix measures 7 mm in diameter. Findings are equivocal for early acute appendicitis. 2. Mild wall thickening of an incompletely distended urinary bladder. Correlate with urinalysis to exclude cystitis. Electronically Signed   By: Maudry Mayhew M.D.   On: 03/19/2022 16:30   DG Chest 2 View  Result Date: 03/16/2022 CLINICAL DATA:  Chest pain EXAM: CHEST - 2 VIEW COMPARISON:  02/21/2021 FINDINGS: The heart size and mediastinal contours are within normal limits. Both lungs are clear. Lateral view is less than optimal due to motion artifacts. The visualized skeletal structures are unremarkable. IMPRESSION: No active cardiopulmonary disease. Electronically Signed   By: Ernie Avena M.D.   On: 03/16/2022 19:29     Consults: Case discussed with Dr. Sheliah Hatch of general surgery and Dr. Durwin Nora of emergency medicine.   Final Reassessment and Plan:   Patient's history of present on this and physical exam findings remain nonspecific at this time.  CT scan with possible early appendicitis and physical exam with severe abdominal pain. History is concerning with his anorexia.  However, still not fully diagnosed at this time.  Telephone consultation with Dr. Kieth Brightly who recommended in person evaluation by surgery.  Patient to transfer to [facility] accepted by Dr. Doren Custard to the emergency room to be evaluated by general  surgery.     Clinical Impression:  1. Generalized abdominal pain      Transfer via POV   Final Clinical Impression(s) / ED Diagnoses Final diagnoses:  Generalized abdominal pain    Rx / DC Orders ED Discharge Orders     None         Tretha Sciara, MD 03/19/22 1719

## 2022-03-19 NOTE — H&P (Signed)
Reason for Consult:abdominal pain Referring Provider: Tretha Sciara  Jesse Bush is an 27 y.o. male.  HPI: 27 yo male with 2 days of left side pain. He was sent home from work and went to ED and was diagnosed with panic attack. The pain continued and now he has been having diarrhea and vague abdominal pain.  Past Medical History:  Diagnosis Date   Depression    not clinically diagnosed but has assumed based off of family history- mom and grandad    Past Surgical History:  Procedure Laterality Date   WISDOM TOOTH EXTRACTION     no sedation    Family History  Problem Relation Age of Onset   Depression Mother    Rheum arthritis Mother    Other Father        does not know his medical history   Autism Sister    Depression Maternal Grandfather    Cancer Maternal Grandfather        bladder   Other Paternal Grandmother        covid in 2021   Healthy Paternal Grandfather        age 19 in 2022    Social History:  reports that he has never smoked. He has never used smokeless tobacco. He reports that he does not currently use alcohol. He reports current drug use. Drug: Marijuana.  Allergies: No Known Allergies  Medications: I have reviewed the patient's current medications.  Results for orders placed or performed during the hospital encounter of 03/19/22 (from the past 48 hour(s))  Lipase, blood     Status: None   Collection Time: 03/19/22  1:30 PM  Result Value Ref Range   Lipase 16 11 - 51 U/L    Comment: Performed at [facility], [address] [place], [place] [address]  Comprehensive metabolic panel     Status: Abnormal   Collection Time: 03/19/22  1:30 PM  Result Value Ref Range   Sodium 139 135 - 145 mmol/L   Potassium 3.9 3.5 - 5.1 mmol/L   Chloride 101 98 - 111 mmol/L   CO2 24 22 - 32 mmol/L   Glucose, Bld 79 70 - 99 mg/dL    Comment: Glucose reference range applies only to samples taken after fasting for at least 8 hours.   BUN 8 6  - 20 mg/dL   Creatinine, Ser 0.87 0.61 - 1.24 mg/dL   Calcium 10.0 8.9 - 10.3 mg/dL   Total Protein 8.5 (H) 6.5 - 8.1 g/dL   Albumin 5.1 (H) 3.5 - 5.0 g/dL   AST 22 15 - 41 U/L   ALT 29 0 - 44 U/L   Alkaline Phosphatase 76 38 - 126 U/L   Total Bilirubin 1.3 (H) 0.3 - 1.2 mg/dL   GFR, Estimated >60 >60 mL/min    Comment: (NOTE) Calculated using the CKD-EPI Creatinine Equation (2021)    Anion gap 14 5 - 15    Comment: Performed at [facility], [place], [place], [place] [address]  CBC     Status: Abnormal   Collection Time: 03/19/22  1:30 PM  Result Value Ref Range   WBC 8.1 4.0 - 10.5 K/uL   RBC 5.78 4.22 - 5.81 MIL/uL   Hemoglobin 17.4 (H) 13.0 - 17.0 g/dL   HCT 49.8 39.0 - 52.0 %   MCV 86.2 80.0 - 100.0 fL   MCH 30.1 26.0 - 34.0 pg   MCHC 34.9 30.0 - 36.0 g/dL  RDW 12.2 11.5 - 15.5 %   Platelets 376 150 - 400 K/uL   nRBC 0.0 0.0 - 0.2 %    Comment: Performed at [facility], [address] [place], [place] [address]  Urinalysis, Routine w reflex microscopic Urine, Clean Catch     Status: Abnormal   Collection Time: 03/19/22  1:30 PM  Result Value Ref Range   Color, Urine YELLOW YELLOW   APPearance CLEAR CLEAR   Specific Gravity, Urine 1.011 1.005 - 1.030   pH 6.0 5.0 - 8.0   Glucose, UA NEGATIVE NEGATIVE mg/dL   Hgb urine dipstick TRACE (A) NEGATIVE   Bilirubin Urine NEGATIVE NEGATIVE   Ketones, ur 40 (A) NEGATIVE mg/dL   Protein, ur NEGATIVE NEGATIVE mg/dL   Nitrite NEGATIVE NEGATIVE   Leukocytes,Ua NEGATIVE NEGATIVE   RBC / HPF 0-5 0 - 5 RBC/hpf   WBC, UA 0-5 0 - 5 WBC/hpf   Bacteria, UA NONE SEEN NONE SEEN   Squamous Epithelial / LPF 0-5 0 - 5 /HPF    Comment: Performed at [facility], [address], [place], [place] [address]    CT ABDOMEN PELVIS W CONTRAST  Result Date: 03/19/2022 CLINICAL DATA:  Intermittent left upper abdominal pain x1 week. EXAM: CT ABDOMEN AND PELVIS WITH CONTRAST  TECHNIQUE: Multidetector CT imaging of the abdomen and pelvis was performed using the standard protocol following bolus administration of intravenous contrast. RADIATION DOSE REDUCTION: This exam was performed according to the departmental dose-optimization program which includes automated exposure control, adjustment of the mA and/or kV according to patient size and/or use of iterative reconstruction technique. CONTRAST:  178mL OMNIPAQUE IOHEXOL 300 MG/ML  SOLN COMPARISON:  CT Aug 03, 2020 FINDINGS: Lower chest: No acute abnormality. Hepatobiliary: No suspicious hepatic lesion. Gallbladder is unremarkable. No biliary ductal dilation. Pancreas: No pancreatic ductal dilation or evidence of acute inflammation. Spleen: No splenomegaly. Adrenals/Urinary Tract: Bilateral adrenal glands appear normal. No hydronephrosis. Kidneys demonstrate symmetric enhancement. Mild wall thickening of an incompletely distended urinary bladder. Stomach/Bowel: Stomach is unremarkable for degree of distension. No pathologic dilation of small or large bowel. Prominent fluid-filled appendix measures 7 mm in diameter on image 73/2. Colon is predominately decompressed limiting evaluation. Vascular/Lymphatic: Normal caliber abdominal aorta. No pathologically enlarged abdominal or pelvic lymph nodes. Reproductive: Prostate is unremarkable. Other: No significant abdominopelvic free fluid. Musculoskeletal: No acute or significant osseous findings. IMPRESSION: 1. Prominent fluid-filled appendix measures 7 mm in diameter. Findings are equivocal for early acute appendicitis. 2. Mild wall thickening of an incompletely distended urinary bladder. Correlate with urinalysis to exclude cystitis. Electronically Signed   By: Dahlia Bailiff M.D.   On: 03/19/2022 16:30    Review of Systems  Constitutional: Negative.   HENT: Negative.    Eyes: Negative.   Respiratory: Negative.    Cardiovascular:  Positive for chest pain.  Gastrointestinal:  Positive for  abdominal pain, diarrhea and nausea.  Genitourinary: Negative.   Musculoskeletal: Negative.   Skin: Negative.   Neurological: Negative.   Endo/Heme/Allergies: Negative.   Psychiatric/Behavioral: Negative.      PE Blood pressure 137/76, pulse 79, temperature 98.5 F (36.9 C), temperature source Oral, resp. rate 17, weight 100.7 kg, SpO2 100 %. Constitutional: NAD; conversant; no deformities Eyes: Moist conjunctiva; no lid lag; anicteric; PERRL Neck: Trachea midline; no thyromegaly Lungs: Normal respiratory effort; no tactile fremitus CV: RRR; no palpable thrills; no pitting edema GI: Abd tender to palpation with guarding RLQ; no palpable hepatosplenomegaly MSK: Normal gait; no clubbing/cyanosis Psychiatric: Appropriate  affect; alert and oriented x3 Lymphatic: No palpable cervical or axillary lymphadenopathy Skin: No major subcutaneous nodules. Warm and dry   Assessment/Plan: 28 yo male with concern for early appendicitis -admit to hospital -ceftriaxone/flagyl -bowel rest -reexamine in the morning, if still tender than plan for lap appendectomy -repeat labs in am  I reviewed last 24 h vitals and pain scores, last 48 h intake and output, last 24 h labs and trends, and last 24 h imaging results.  This care required high  level of medical decision making.   Arta Bruce Terrisa Curfman 03/19/2022, 7:41 PM

## 2022-03-19 NOTE — Discharge Instructions (Signed)
You were observed overnight in the hospital after presenting with left sided rib pain, diarrhea, and describing episodes over the last year of extremity or whole body numbness, feeling like you were under water, and palpitations. A CT scan was performed for the pain you described, which was interpreted as "equivocal for early appendicitis". Given your symptoms and physical exam, appendicitis was ruled out. We will plan for you to go home on antibiotics to empirically treat gastroenteritis given diarrhea initially, and recommend follow up with your primary care doctor and possibly GI.

## 2022-03-19 NOTE — ED Notes (Signed)
IV left in place and secured.

## 2022-03-19 NOTE — ED Provider Notes (Addendum)
  Physical Exam  BP (!) 147/77 (BP Location: Left Arm)   Pulse 73   Temp 97.7 F (36.5 C) (Oral)   Resp 17   Wt 100.7 kg   SpO2 100%   BMI 31.84 kg/m   Physical Exam  Procedures  Procedures  ED Course / MDM    Medical Decision Making Amount and/or Complexity of Data Reviewed Labs: ordered. Radiology: ordered.  Risk Prescription drug management.   Discussed with Dr. Rich Number who will come to see patient       Tedd Sias, [place] 03/19/22 2002    Audley Hose, MD 03/19/22 2039

## 2022-03-19 NOTE — ED Notes (Signed)
Urine cup given and pt advised that urine sample is needed.  IV placed in LAC and saline locked after blood draw.  Pt was unable to tolerate SL so I removed it per pt request.

## 2022-03-20 DIAGNOSIS — R109 Unspecified abdominal pain: Secondary | ICD-10-CM | POA: Diagnosis present

## 2022-03-20 LAB — CBC
HCT: 43.7 % (ref 39.0–52.0)
Hemoglobin: 15.5 g/dL (ref 13.0–17.0)
MCH: 31.3 pg (ref 26.0–34.0)
MCHC: 35.5 g/dL (ref 30.0–36.0)
MCV: 88.1 fL (ref 80.0–100.0)
Platelets: 284 10*3/uL (ref 150–400)
RBC: 4.96 MIL/uL (ref 4.22–5.81)
RDW: 12.1 % (ref 11.5–15.5)
WBC: 8.9 10*3/uL (ref 4.0–10.5)
nRBC: 0 % (ref 0.0–0.2)

## 2022-03-20 MED ORDER — AMOXICILLIN-POT CLAVULANATE 500-125 MG PO TABS: 1.0000 | ORAL_TABLET | Freq: Three times a day (TID) | ORAL | 0 refills | Status: AC

## 2022-03-20 MED ORDER — ONDANSETRON HCL 4 MG PO TABS: 4.0000 mg | ORAL_TABLET | Freq: Three times a day (TID) | ORAL | 0 refills | Status: DC | PRN

## 2022-03-20 NOTE — Progress Notes (Signed)
Patient given written and verbal discharge instructions-verbalized understanding. Peripheral IV x2 removed without complications. Patient voices no questions or concerns at this time.

## 2022-03-20 NOTE — Progress Notes (Signed)
Subjective/Chief Complaint: Denies nausea or diarrhea overnight, still some intermittent pain. No pain currently, got morphine around 3 hours ago. Confirms his pain was in the left upper quadrant, essentially the left lateral costal margin. Reports no bowel movement in last 24h.  Reports he has had 5 episodes in the last year. Reports the first three consisted of palpitations, left lower and upper extremity numbness, and occurred while at work (works at [employer], high intensity job). The last 2 episodes began with pain at his left lower rib in approximately anterior axillary line, associated with whole body and facial numbness.    Objective: Vital signs in last 24 hours: Temp:  [97.7 F (36.5 C)-99.4 F (37.4 C)] 98.7 F (37.1 C) (01/02 0400) Pulse Rate:  [58-82] 60 (01/02 0400) Resp:  [16-18] 18 (01/02 0400) BP: (129-158)/(66-94) 140/80 (01/02 0400) SpO2:  [96 %-100 %] 98 % (01/02 0400) Weight:  [100.7 kg] 100.7 kg (01/01 2221)    Intake/Output from previous day: 01/01 0701 - 01/02 0700 In: 691.8 [I.V.:546.8; IV Piggyback:144.9] Out: -  Intake/Output this shift: No intake/output data recorded.  A&Ox3, no distress Unlabored respirations Abdomen soft, nondistended, subjectively tender in left upper, left lower, epigastric, periumbilical and right upper quadrants. No significant tenderness in RLQ. NO peritoneal signs.   Lab Results:  Recent Labs    03/19/22 2232 03/20/22 0318  WBC 8.9 8.9  HGB 16.0 15.5  HCT 47.7 43.7  PLT 283 284   BMET Recent Labs    03/19/22 1330 03/19/22 2232  NA 139  --   K 3.9  --   CL 101  --   CO2 24  --   GLUCOSE 79  --   BUN 8  --   CREATININE 0.87 0.99  CALCIUM 10.0  --    PT/INR No results for input(s): "LABPROT", "INR" in the last 72 hours. ABG No results for input(s): "PHART", "HCO3" in the last 72 hours.  Invalid input(s): "PCO2", "PO2"  Studies/Results: CT ABDOMEN PELVIS W CONTRAST  Result Date: 03/19/2022 CLINICAL DATA:   Intermittent left upper abdominal pain x1 week. EXAM: CT ABDOMEN AND PELVIS WITH CONTRAST TECHNIQUE: Multidetector CT imaging of the abdomen and pelvis was performed using the standard protocol following bolus administration of intravenous contrast. RADIATION DOSE REDUCTION: This exam was performed according to the departmental dose-optimization program which includes automated exposure control, adjustment of the mA and/or kV according to patient size and/or use of iterative reconstruction technique. CONTRAST:  118mL OMNIPAQUE IOHEXOL 300 MG/ML  SOLN COMPARISON:  CT Aug 03, 2020 FINDINGS: Lower chest: No acute abnormality. Hepatobiliary: No suspicious hepatic lesion. Gallbladder is unremarkable. No biliary ductal dilation. Pancreas: No pancreatic ductal dilation or evidence of acute inflammation. Spleen: No splenomegaly. Adrenals/Urinary Tract: Bilateral adrenal glands appear normal. No hydronephrosis. Kidneys demonstrate symmetric enhancement. Mild wall thickening of an incompletely distended urinary bladder. Stomach/Bowel: Stomach is unremarkable for degree of distension. No pathologic dilation of small or large bowel. Prominent fluid-filled appendix measures 7 mm in diameter on image 73/2. Colon is predominately decompressed limiting evaluation. Vascular/Lymphatic: Normal caliber abdominal aorta. No pathologically enlarged abdominal or pelvic lymph nodes. Reproductive: Prostate is unremarkable. Other: No significant abdominopelvic free fluid. Musculoskeletal: No acute or significant osseous findings. IMPRESSION: 1. Prominent fluid-filled appendix measures 7 mm in diameter. Findings are equivocal for early acute appendicitis. 2. Mild wall thickening of an incompletely distended urinary bladder. Correlate with urinalysis to exclude cystitis. Electronically Signed   By: Dahlia Bailiff M.D.   On: 03/19/2022 16:30  Anti-infectives: Anti-infectives (From admission, onward)    Start     Dose/Rate Route Frequency  Ordered Stop   03/19/22 2000  cefTRIAXone (ROCEPHIN) 2 g in sodium chloride 0.9 % 100 mL IVPB       See Hyperspace for full Linked Orders Report.   2 g 200 mL/hr over 30 Minutes Intravenous Every 24 hours 03/19/22 1938 03/26/22 1959   03/19/22 2000  metroNIDAZOLE (FLAGYL) IVPB 500 mg       See Hyperspace for full Linked Orders Report.   500 mg 100 mL/hr over 60 Minutes Intravenous Every 12 hours 03/19/22 1938 03/26/22 1959       Assessment/Plan: His history and exam are not consistent with appendicitis. Afebrile, Normal WBC. I have reviewed his CT images personally. Will try soft diet today, potential DC this afternoon. Recommend outpatient GI follow up as well as PCP follow up for the episodes described above.      LOS: 1 day    Clovis Riley 03/20/2022

## 2022-03-21 ENCOUNTER — Ambulatory Visit (INDEPENDENT_AMBULATORY_CARE_PROVIDER_SITE_OTHER): Payer: BC Managed Care – PPO | Admitting: Family Medicine

## 2022-03-21 ENCOUNTER — Encounter: Payer: Self-pay | Admitting: Family Medicine

## 2022-03-21 VITALS — BP 100/60 | HR 83 | Temp 97.8°F | Ht 70.0 in | Wt 222.2 lb

## 2022-03-21 DIAGNOSIS — R1032 Left lower quadrant pain: Secondary | ICD-10-CM

## 2022-03-21 DIAGNOSIS — R1012 Left upper quadrant pain: Secondary | ICD-10-CM

## 2022-03-21 DIAGNOSIS — R140 Abdominal distension (gaseous): Secondary | ICD-10-CM

## 2022-03-21 LAB — CBC WITH DIFFERENTIAL/PLATELET
Basophils Absolute: 0.1 10*3/uL (ref 0.0–0.1)
Basophils Relative: 0.7 % (ref 0.0–3.0)
Eosinophils Absolute: 0 10*3/uL (ref 0.0–0.7)
Eosinophils Relative: 0.5 % (ref 0.0–5.0)
HCT: 47.9 % (ref 39.0–52.0)
Hemoglobin: 16.9 g/dL (ref 13.0–17.0)
Lymphocytes Relative: 14.5 % (ref 12.0–46.0)
Lymphs Abs: 1.1 10*3/uL (ref 0.7–4.0)
MCHC: 35.2 g/dL (ref 30.0–36.0)
MCV: 88 fl (ref 78.0–100.0)
Monocytes Absolute: 0.6 10*3/uL (ref 0.1–1.0)
Monocytes Relative: 7.7 % (ref 3.0–12.0)
Neutro Abs: 6 10*3/uL (ref 1.4–7.7)
Neutrophils Relative %: 76.6 % (ref 43.0–77.0)
Platelets: 335 10*3/uL (ref 150.0–400.0)
RBC: 5.44 Mil/uL (ref 4.22–5.81)
RDW: 12.7 % (ref 11.5–15.5)
WBC: 7.9 10*3/uL (ref 4.0–10.5)

## 2022-03-21 LAB — COMPREHENSIVE METABOLIC PANEL
ALT: 24 U/L (ref 0–53)
AST: 22 U/L (ref 0–37)
Albumin: 4.8 g/dL (ref 3.5–5.2)
Alkaline Phosphatase: 71 U/L (ref 39–117)
BUN: 7 mg/dL (ref 6–23)
CO2: 20 mEq/L (ref 19–32)
Calcium: 9.9 mg/dL (ref 8.4–10.5)
Chloride: 104 mEq/L (ref 96–112)
Creatinine, Ser: 0.88 mg/dL (ref 0.40–1.50)
GFR: 120.68 mL/min (ref >60.00)
Glucose, Bld: 77 mg/dL (ref 70–99)
Potassium: 3.5 mEq/L (ref 3.5–5.1)
Sodium: 140 mEq/L (ref 135–145)
Total Bilirubin: 1.1 mg/dL (ref 0.2–1.2)
Total Protein: 7.6 g/dL (ref 6.0–8.3)

## 2022-03-21 NOTE — Progress Notes (Signed)
Phone [phone] In person visit   Subjective:   Jesse Bush is a 27 y.o. year old very pleasant male patient who presents for/with See problem oriented charting Chief Complaint  Patient presents with   Abdominal Pain    Pt is here to f/u from hosp visit due to abd pain and he is feeling worse. He can't burp or pass gas, he states he still has a lot of gas build.    Past Medical History-  Patient Active Problem List   Diagnosis Date Noted   Abdominal pain 03/20/2022   Acute appendicitis 03/19/2022   Depression 01/17/2021    Medications- reviewed and updated Current Outpatient Medications  Medication Sig Dispense Refill   acetaminophen (TYLENOL) 325 MG tablet Take 650 mg by mouth every 6 (six) hours as needed for mild pain or headache.     amoxicillin-clavulanate (AUGMENTIN) 500-125 MG tablet Take 1 tablet by mouth 3 (three) times daily for 5 days. 15 tablet 0   ondansetron (ZOFRAN) 4 MG tablet Take 1 tablet (4 mg total) by mouth every 8 (eight) hours as needed for nausea or vomiting. 20 tablet 0   No current facility-administered medications for this visit.     Objective:  BP 100/60   Pulse 83   Temp 97.8 F (36.6 C)   Ht 5\' 10"  (1.778 m)   Wt 222 lb 3.2 oz (100.8 kg)   SpO2 97%   BMI 31.88 kg/m  Gen: NAD, resting comfortably CV: RRR no murmurs rubs or gallops Lungs: CTAB no crackles, wheeze, rhonchi Abdomen: diffuse abdominal pain, bloating noted. Focused pain in LLQ and left subcostal area but no rebounding or guarding. Active bowel sounds but would not call hyperactive.   Ext: no edema Skin: warm, dry     Assessment and Plan   # Hospital follow-up S: Patient was admitted from January 1 to March 20, 2022.  Presented with left subcostal pain for 2 days as well as later diarrhea and diffuse abdominal pain-CT scan read as equivocal for appendicitis (admitted due to concern for early appendicitis and started on ceftriaxone and Flagyl but history and physical  exam did not appear consistent with this and he was admitted for observation.  He did not develop right lower quadrant pain or symptoms to suggest appendicitis during hospitalization.  He was able to tolerate diet and presenting symptoms improved by time of discharge and he was recommended close outpatient follow-up.  He was discharged on Augmentin and recommended outpatient GI follow-up as well.  Did have hospital visit a couple days prior and emergency room only with negative troponin trend- did have shortness of breath and dizziness which he thinks was panic related but was not similar to his other pains. CBCand BMP on that initial test reassuring as well as CXR   He also complained of symptoms prior to admission including palpitations, left lower and upper extremity weakness, anxiety often related to work with high intensity job-with 5 episodes of similar in the last year  He did initially have high hemoglobin but this trended down-may have been a sign of dehydration-also had some ketones in urine which could be a sign of dehydration bilirubin was slightly high but appeared consistent with baseline.  Left hospital yesterday afternoon- he reports he did not feel significantly better than when discharged. Only able to eat bland grits and salad with spinach and some veggies- very small quantity of both. Felt ok afterwards but then felt gaseous distension and need for flatus without passing  flatus. Started feeling a lot of pressure and did feel pain in RLQ and swelling/gaseous distension. Was so uncomfortable did not feel safe driving. Considered going back to the ER. He states no flatus since going to hospital but did have small  diarrhea bowel movement- describes as sticky, slow wet mud. Has been belching more. He has been having emesis- both at hospital before leaving and later in the day and this morning- no blood in it- clear or yellow. He is trying to drink water but feels dehydrated. Did not have dose  of antibiotic last night or this morning. Has not trie dnausea medicine yet - has not been able to pick up.   A/P: 27 year old male with diffuse abdominal pain but also focused on exam left subcostal and in left lower quadrant- also with some pain in right lower quadrant (and prior equivocal CTfor appendicitis recently discharged by surgery team yesterday) who states pain and course overall stable form when he went into hospital but not worsening. Bloating is a large portion of his symptoms and continues to have nausea and vomiting but has not been able to pick up anti nausea meds yet -had bowel movement in office today and felt improvement in lower abdominal pain/pressure in both LLQ and RLQ -I am still concerned this is a mild appendicitis but he prefers to not have this removed unless absolutely necessary  We discussed following options: Return to hospital now  Stat CT scan to see if appendicitis evolving (but symptoms not worse, afebrile, doubt SBO with having bowel movement this AM x2 ). Also considered  x-ray KUB but think likely low yield with symptoms so similar today as admission per his report Outpatient trial- pick up zofran, pick up antibiotics, trial gas-x over the counter and update labs. If labs substantially worse OR if his symptoms worsen or fail to improve in next 24-48 hours either return to hospital or let me repeat CT).    Recommended follow up: regardless of how things go - give me an update by calling or mycharting in about 2 days. If worsening- back to ER   Lab/Order associations:   ICD-10-CM   1. LLQ abdominal pain  R10.32 CBC with Differential/Platelet    Comprehensive metabolic panel    2. LUQ abdominal pain  R10.12 CBC with Differential/Platelet    Comprehensive metabolic panel    3. Bloating  R14.0 CBC with Differential/Platelet    Comprehensive metabolic panel     Time Spent: 33 minutes of total time (9:15 AM- 9:48 AM) was spent on the date of the encounter  performing the following actions: chart review prior to seeing the patient including summarizing labs and discharge summary, obtaining history, performing a medically necessary exam, counseling on the treatment plan, placing orders, and documenting in our EHR.    Return precautions advised.  Garret Reddish, MD

## 2022-03-21 NOTE — Discharge Summary (Signed)
Physician Discharge Summary  Patient ID: Jesse Bush MRN: [id number] DOB/AGE: 19-Aug-1995 27 y.o.  Admit date: 03/19/2022 Discharge date: 03/21/2022  Admission Diagnoses: left upper quadrant abdominal pain  Discharge Diagnoses:  Principal Problem:   Acute appendicitis Active Problems:   Abdominal pain   Discharged Condition: good  Hospital Course: Patient presented with left subcostal pain and symptoms as described in my rounding note from 03/20/2022.  He had a CT scan that was read as equivocal for appendicitis, but given that his history and physical exam were not consistent with appendicitis, he was admitted for observation.  He did not develop any right lower quadrant pain or symptoms to suggest appendicitis.  He was able to tolerate diet and his presenting symptoms improved by the time of discharge.   Discharge Exam: Blood pressure (!) 127/90, pulse 66, temperature 98.2 F (36.8 C), temperature source Oral, resp. rate 18, height 5\' 10"  (1.778 m), weight 100.7 kg, SpO2 100 %. See rounding note  Disposition: Discharge disposition: 01-Home or Self Care        Allergies as of 03/20/2022   No Known Allergies      Medication List     TAKE these medications    acetaminophen 325 MG tablet Commonly known as: TYLENOL Take 650 mg by mouth every 6 (six) hours as needed for mild pain or headache.   amoxicillin-clavulanate 500-125 MG tablet Commonly known as: Augmentin Take 1 tablet by mouth 3 (three) times daily for 5 days.   ondansetron 4 MG tablet Commonly known as: Zofran Take 1 tablet (4 mg total) by mouth every 8 (eight) hours as needed for nausea or vomiting.        Follow-up Information     Connect with your PCP/Specialist as discussed. Schedule an appointment as soon as possible for a visit .   Why: Follow up with your primary care provider to go over the symptoms you have been having during the 5 episodes you described this year. You may also benefit from  consultation with a gastroenterologist if persistent GI symptoms. Contact information: [email or web address] Call our physician referral line at [phone].                Signed: Clovis Riley 03/21/2022, 6:57 AM

## 2022-03-21 NOTE — Patient Instructions (Addendum)
Please stop by lab before you go If you have mychart- we will send your results within 3 business days of [place] receiving them.  If you do not have mychart- we will call you about results within 5 business days of [place] receiving them.  *please also note that you will see labs on mychart as soon as they post. I will later go in and write notes on them- will say "notes from Dr. Yong Channel"   27 year old male with diffuse abdominal pain but also focused on exam left subcostal and in left lower quadrant- also with some pain in right lower quadrant (and prior equivocal CTfor appendicitis recently discharged by surgery team yesterday) who states pain and course overall stable form when he went into hospital but not worsening. Bloating is a large portion of his symptoms and continues to have nausea and vomiting but has not been able to pick up anti nausea meds yet -had bowel movement in office today and felt improvement in lower abdominal pain/pressure in both LLQ and RLQ  We discussed following options: Return to hospital now  Stat CT scan to see if appendicitis evolving (but symptoms not worse, afebrile, doubt SBO with having bowel movement this AM x2 ). Also considered  x-ray KUB but think likely low yield with symptoms so similar today as admission per his report Outpatient trial- pick up zofran, pick up antibiotics, trial gas-x over the counter and update labs. If labs substantially worse OR if his symptoms worsen or fail to improve in next 24-48 hours either return to hospital or let me repeat CT).   Recommended follow up: regardless of how things go - give me an update by calling or mycharting in about 2 days.

## 2022-03-23 ENCOUNTER — Telehealth: Payer: Self-pay | Admitting: Family Medicine

## 2022-03-23 NOTE — Telephone Encounter (Signed)
Patient states: - PCP informed him to update him on symptoms following OV on 03/21/22 - PCP treatment plan is working and he is doing well

## 2022-03-23 NOTE — Telephone Encounter (Signed)
FYI on patient.  

## 2022-03-24 NOTE — Telephone Encounter (Signed)
Thrilled he is doing better - great news and appreciate update

## 2022-04-28 IMAGING — CT CT RENAL STONE PROTOCOL
2 of 4 series · 17 of 46 positions shown, 19 images · non-contrast
Comparison: None.

CLINICAL DATA: Left flank pain back pain

EXAM:
CT ABDOMEN AND PELVIS WITHOUT CONTRAST
TECHNIQUE: Multidetector CT imaging of the abdomen and pelvis was performed
following the standard protocol without IV contrast.

[Series 2: axial st · axial · 0.82mm/px · z∈[+1245,+1680]mm · 14 of 99 slices shown, 16 images]
[im 6/99  soft-tissue]
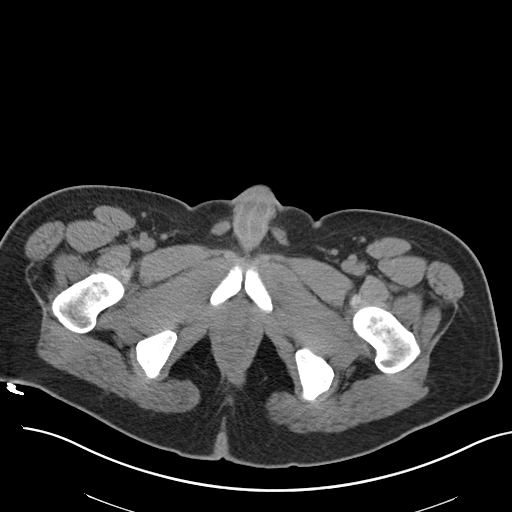
[im 6/99  bone]
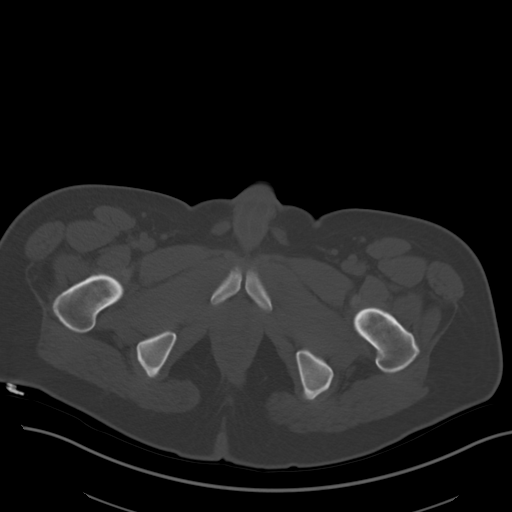
[im 11/99  soft-tissue]
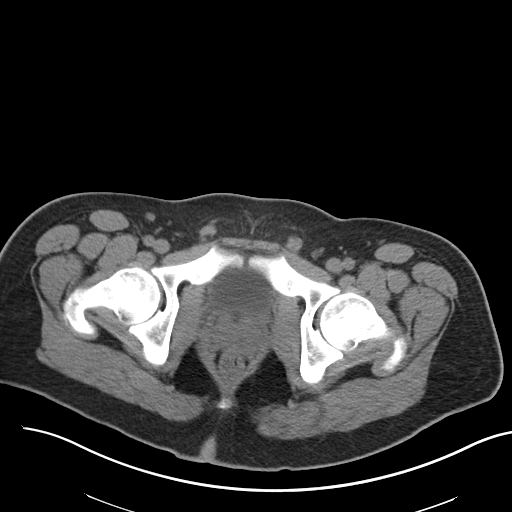
[im 22/99  soft-tissue]
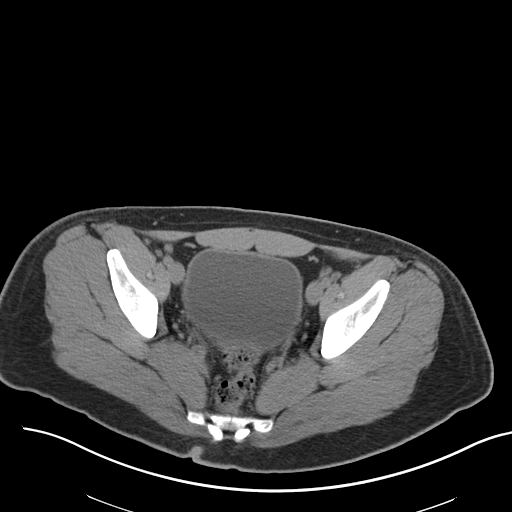
[im 28/99  soft-tissue]
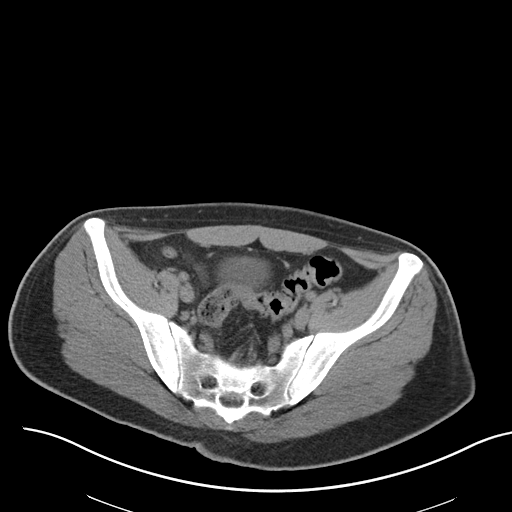
[im 33/99  soft-tissue]
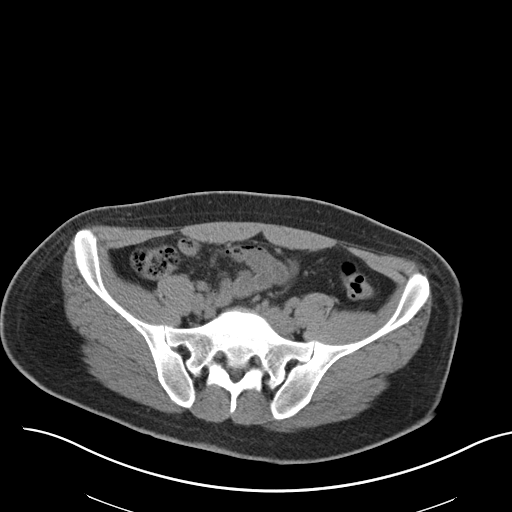
[im 39/99  soft-tissue]
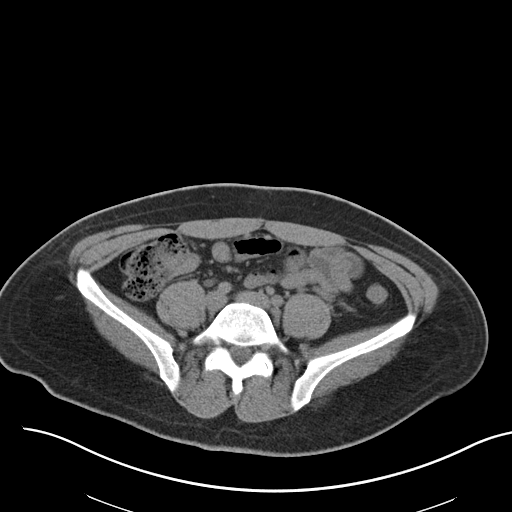
[im 44/99  soft-tissue]
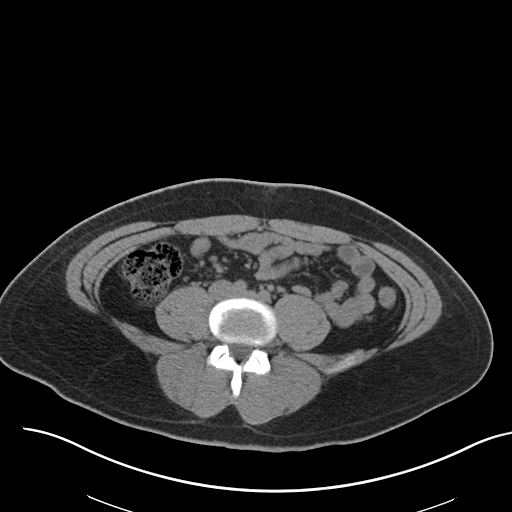
[im 55/99  soft-tissue]
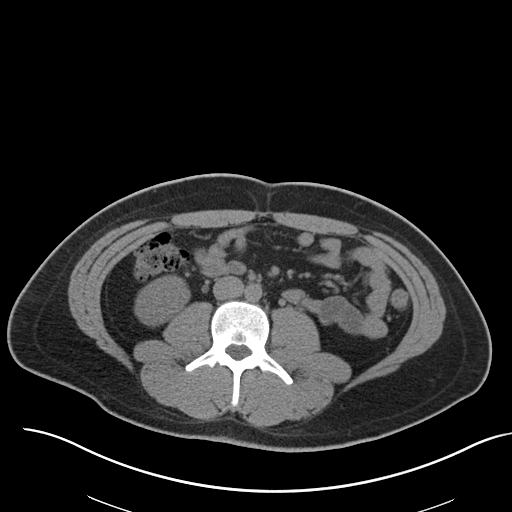
[im 60/99  soft-tissue]
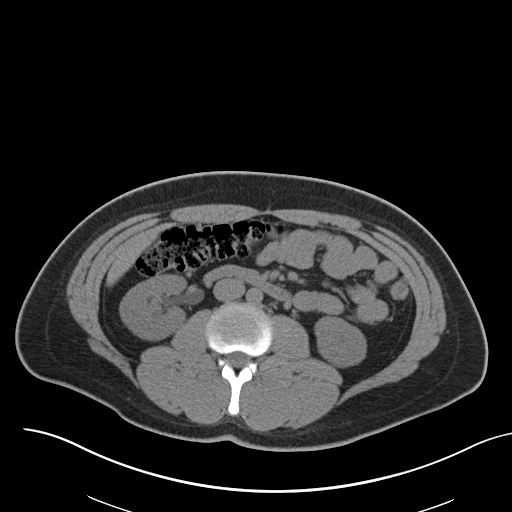
[im 60/99  bone]
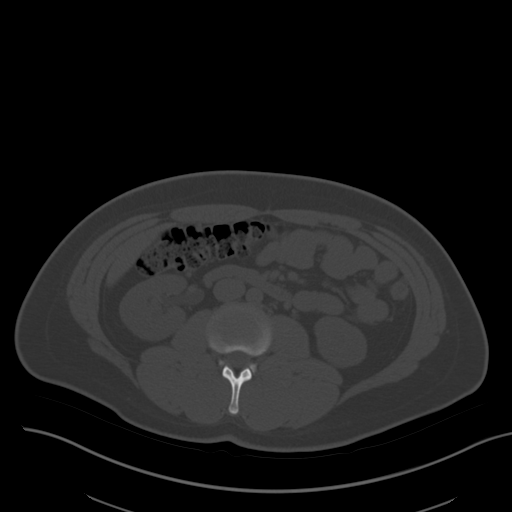
[im 66/99  soft-tissue]
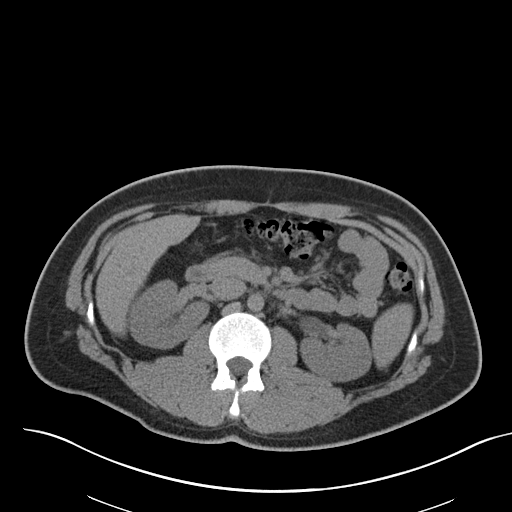
[im 71/99  soft-tissue]
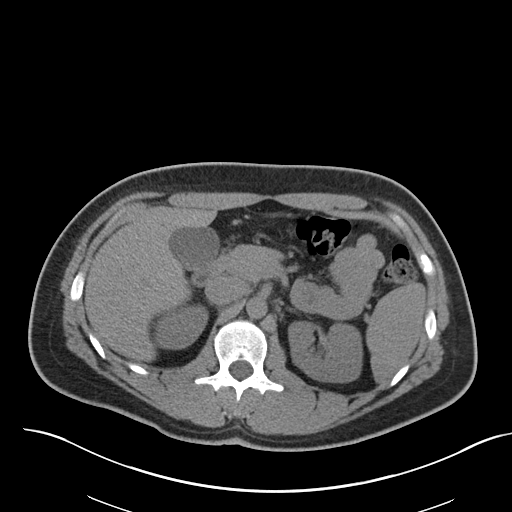
[im 77/99  soft-tissue]
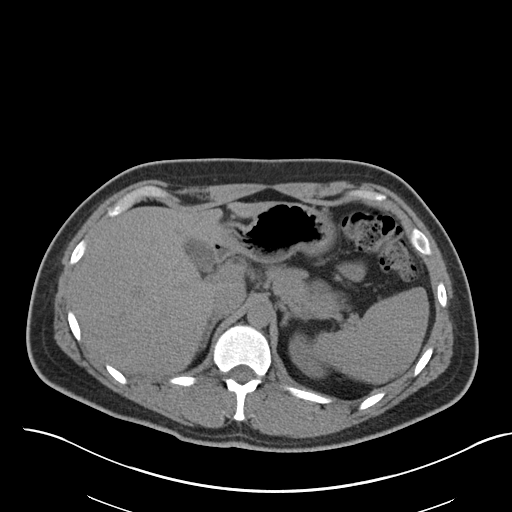
[im 88/99  soft-tissue]
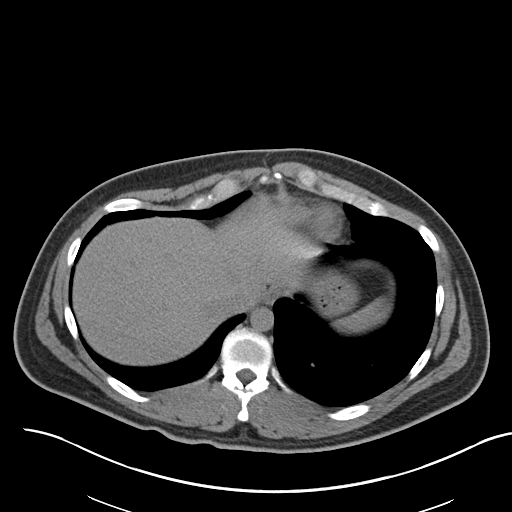
[im 93/99  soft-tissue]
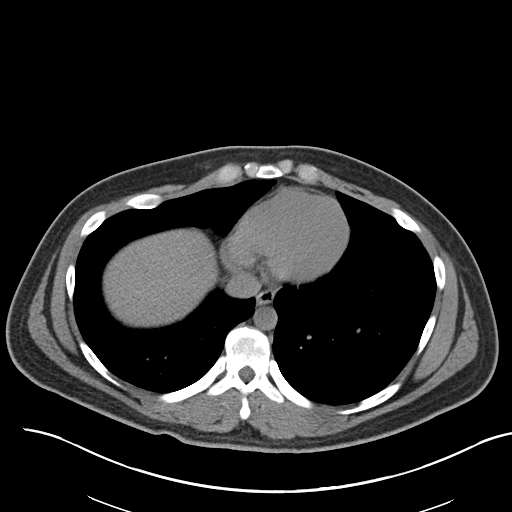

[Series 4: coronal · coronal · 0.98mm/px · 3 of 117 slices shown]
[im 39/117  soft-tissue]
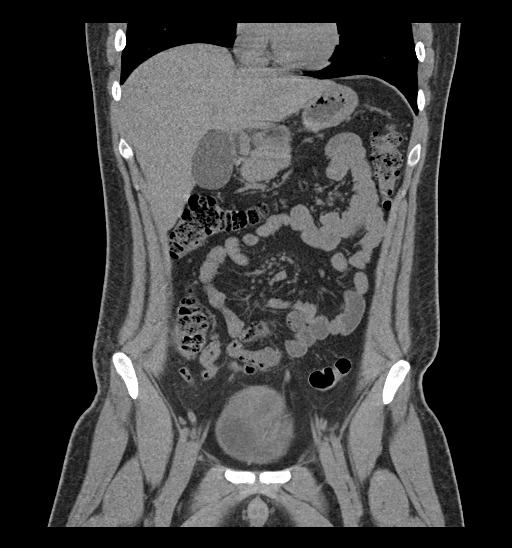
[im 52/117  soft-tissue]
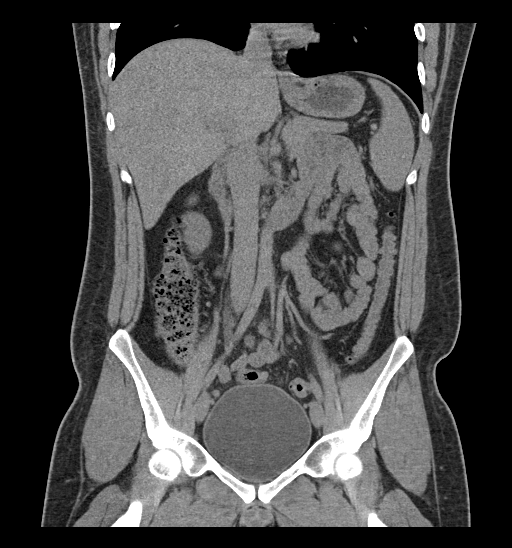
[im 65/117  soft-tissue]
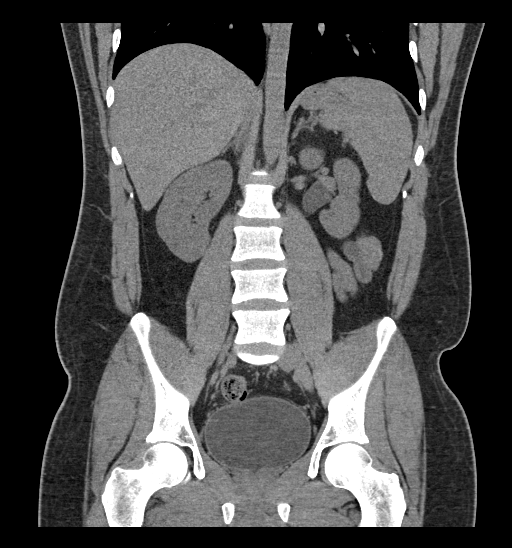

[17 of 46 positions shown; findings below may reference images not displayed]

FINDINGS: Lower chest: Lung bases demonstrate no acute consolidation or
effusion. Normal cardiac size.

Hepatobiliary: No focal liver abnormality is seen. No gallstones,
gallbladder wall thickening, or biliary dilatation.

Pancreas: Unremarkable. No pancreatic ductal dilatation or
surrounding inflammatory changes.

Spleen: Normal in size without focal abnormality.

Adrenals/Urinary Tract: Adrenal glands are unremarkable. Kidneys are
normal, without renal calculi, focal lesion, or hydronephrosis.
Bladder is unremarkable.

Stomach/Bowel: Stomach is within normal limits. Appendix appears
normal. No evidence of bowel wall thickening, distention, or
inflammatory changes.

Vascular/Lymphatic: No significant vascular findings are present. No
enlarged abdominal or pelvic lymph nodes.

Reproductive: Prostate is unremarkable.

Other: No abdominal wall hernia or abnormality. No abdominopelvic
ascites.

Musculoskeletal: No acute or significant osseous findings.
IMPRESSION: Negative. No CT evidence for acute intra-abdominal or pelvic
abnormality.

## 2022-05-14 ENCOUNTER — Ambulatory Visit: Payer: BC Managed Care – PPO | Admitting: Family Medicine

## 2022-05-15 ENCOUNTER — Encounter: Payer: Self-pay | Admitting: Family Medicine

## 2022-05-15 ENCOUNTER — Ambulatory Visit (INDEPENDENT_AMBULATORY_CARE_PROVIDER_SITE_OTHER): Payer: BC Managed Care – PPO | Admitting: Family Medicine

## 2022-05-15 VITALS — Temp 98.0°F | Ht 70.0 in | Wt 214.2 lb

## 2022-05-15 DIAGNOSIS — K649 Unspecified hemorrhoids: Secondary | ICD-10-CM | POA: Diagnosis not present

## 2022-05-15 MED ORDER — HYDROCORTISONE ACETATE 25 MG RE SUPP: 25.0000 mg | Freq: Two times a day (BID) | RECTAL | 0 refills | Status: AC

## 2022-05-15 NOTE — Patient Instructions (Signed)
It was very nice to see you today!  You have hemorrhoids.  Please make sure that you are getting plenty of water and fiber in your diet.  You can start a fiber supplement such as Benefiber or Metamucil.  Please try a stool softener such as Colace or MiraLAX.  You should have 1-2 soft bowel movements daily.  Please use the hydrocortisone suppositories.  Let us know if your symptoms are not improving in the next 1-2 weeks.  Take care, Dr Jerline Pain  PLEASE NOTE:  If you had any lab tests, please let us know if you have not heard back within a few days. You may see your results on mychart before we have a chance to review them but we will give you a call once they are reviewed by [place].   If we ordered any referrals today, please let us know if you have not heard from their office within the next week.   If you had any urgent prescriptions sent in today, please check with the pharmacy within an hour of our visit to make sure the prescription was transmitted appropriately.   Please try these tips to maintain a healthy lifestyle:  Eat at least 3 REAL meals and 1-2 snacks per day.  Aim for no more than 5 hours between eating.  If you eat breakfast, please do so within one hour of getting up.   Each meal should contain half fruits/vegetables, one quarter protein, and one quarter carbs (no bigger than a computer mouse)  Cut down on sweet beverages. This includes juice, soda, and sweet tea.   Drink at least 1 glass of water with each meal and aim for at least 8 glasses per day  Exercise at least 150 minutes every week.

## 2022-05-15 NOTE — Progress Notes (Signed)
   Jesse Bush is a 27 y.o. male who presents today for an office visit.  Assessment/Plan:  Hemorrhoids  No red flags.  Has obvious external hemorrhoids on exam which is likely the source of his rectal bleeding with bowel movements.  We discussed bowel regimen including adequate hydration and fiber supplementation.  Also recommended stool softeners.  Will start hydrocortisone as well given his degree of pain.  He can use sitz bath as well. Dicussed importance of not straining with bowel movement and avoiding long time sitting on the toilet.  We discussed reasons to return to care.  He will let me know if not improving over the next 1 to 2 weeks.     Subjective:  HPI:  Patient here with recurrent rectal bleeding with BM. This has been going on for about 2 weeks. Blood is very bright red and he notices it when having bowel movements.  He feels like there is a quite a bit of blood. Sometimes bleeding inbetween bowel movements with a few drops. He has a lot of pain with bowel movements. No treatments tried. He had hemorrhoids about 1-2 years ago. No bleeding with this. Just pain and swelling.        Objective:  Physical Exam: Temp 98 F (36.7 C) (Temporal)   Ht 5' 10"$  (1.778 m)   Wt 214 lb 3.2 oz (97.2 kg)   BMI 30.73 kg/m   Gen: No acute distress, resting comfortably CV: Regular rate and rhythm with no murmurs appreciated Pulm: Normal work of breathing, clear to auscultation bilaterally with no crackles, wheezes, or rhonchi GU: 2 small external hemorrhoids approximately 4 to 5 mm in diameter along anterior edge of anus. Neuro: Grossly normal, moves all extremities Psych: Normal affect and thought content      Mailyn Steichen M. Jerline Pain, MD 05/15/2022 2:10 PM

## 2024-02-23 ENCOUNTER — Emergency Department (HOSPITAL_COMMUNITY)

## 2024-02-23 ENCOUNTER — Encounter (HOSPITAL_COMMUNITY): Payer: Self-pay | Admitting: *Deleted

## 2024-02-23 ENCOUNTER — Emergency Department (HOSPITAL_COMMUNITY)
Admission: EM | Admit: 2024-02-23 | Discharge: 2024-02-23 | Disposition: A | Discharge status: Home / Self Care | Attending: Emergency Medicine | Admitting: Emergency Medicine

## 2024-02-23 ENCOUNTER — Other Ambulatory Visit: Payer: Self-pay

## 2024-02-23 DIAGNOSIS — R112 Nausea with vomiting, unspecified: Secondary | ICD-10-CM

## 2024-02-23 DIAGNOSIS — R1031 Right lower quadrant pain: Secondary | ICD-10-CM

## 2024-02-23 LAB — URINALYSIS, ROUTINE W REFLEX MICROSCOPIC
Bilirubin Urine: NEGATIVE
Glucose, UA: NEGATIVE mg/dL
Hgb urine dipstick: NEGATIVE
Ketones, ur: 80 mg/dL — AB
Leukocytes,Ua: NEGATIVE
Nitrite: NEGATIVE
Protein, ur: 100 mg/dL — AB
Specific Gravity, Urine: 1.03 (ref 1.005–1.030)
pH: 6 (ref 5.0–8.0)

## 2024-02-23 LAB — CBC WITH DIFFERENTIAL/PLATELET
Abs Immature Granulocytes: 0.16 K/uL — ABNORMAL HIGH (ref 0.00–0.07)
Basophils Absolute: 0.1 K/uL (ref 0.0–0.1)
Basophils Relative: 0 %
Eosinophils Absolute: 0 K/uL (ref 0.0–0.5)
Eosinophils Relative: 0 %
HCT: 48.7 % (ref 39.0–52.0)
Hemoglobin: 17.6 g/dL — ABNORMAL HIGH (ref 13.0–17.0)
Immature Granulocytes: 1 %
Lymphocytes Relative: 4 %
Lymphs Abs: 1 K/uL (ref 0.7–4.0)
MCH: 30.4 pg (ref 26.0–34.0)
MCHC: 36.1 g/dL — ABNORMAL HIGH (ref 30.0–36.0)
MCV: 84.3 fL (ref 80.0–100.0)
Monocytes Absolute: 0.7 K/uL (ref 0.1–1.0)
Monocytes Relative: 3 %
Neutro Abs: 21.8 K/uL — ABNORMAL HIGH (ref 1.7–7.7)
Neutrophils Relative %: 92 %
Platelets: 461 K/uL — ABNORMAL HIGH (ref 150–400)
RBC: 5.78 MIL/uL (ref 4.22–5.81)
RDW: 11.9 % (ref 11.5–15.5)
WBC: 23.7 K/uL — ABNORMAL HIGH (ref 4.0–10.5)
nRBC: 0 % (ref 0.0–0.2)

## 2024-02-23 LAB — LIPASE, BLOOD: Lipase: 27 U/L (ref 11–51)

## 2024-02-23 LAB — COMPREHENSIVE METABOLIC PANEL WITH GFR
ALT: 27 U/L (ref 0–44)
AST: 20 U/L (ref 15–41)
Albumin: 4.5 g/dL (ref 3.5–5.0)
Alkaline Phosphatase: 73 U/L (ref 38–126)
Anion gap: 11 (ref 5–15)
BUN: 11 mg/dL (ref 6–20)
CO2: 26 mmol/L (ref 22–32)
Calcium: 9.3 mg/dL (ref 8.9–10.3)
Chloride: 101 mmol/L (ref 98–111)
Creatinine, Ser: 1.04 mg/dL (ref 0.61–1.24)
GFR, Estimated: >60 mL/min (ref >60)
Glucose, Bld: 72 mg/dL (ref 70–99)
Potassium: 4.3 mmol/L (ref 3.5–5.1)
Sodium: 138 mmol/L (ref 135–145)
Total Bilirubin: 1.6 mg/dL — ABNORMAL HIGH (ref 0.0–1.2)
Total Protein: 8.3 g/dL — ABNORMAL HIGH (ref 6.5–8.1)

## 2024-02-23 LAB — I-STAT CG4 LACTIC ACID, ED: Lactic Acid, Venous: 1.2 mmol/L (ref 0.5–1.9)

## 2024-02-23 MED ORDER — ONDANSETRON 4 MG PO TBDP: 4.0000 mg | ORAL_TABLET | Freq: Three times a day (TID) | ORAL | 0 refills | Status: AC | PRN

## 2024-02-23 MED ORDER — SODIUM CHLORIDE 0.9 % IV BOLUS
1000.0000 mL | Freq: Once | INTRAVENOUS | Status: AC
  Administered 2024-02-23: 1000 mL via INTRAVENOUS

## 2024-02-23 MED ORDER — ONDANSETRON 4 MG PO TBDP
4.0000 mg | ORAL_TABLET | Freq: Once | ORAL | Status: AC
  Administered 2024-02-23: 4 mg via ORAL
  Filled 2024-02-23: qty 1

## 2024-02-23 MED ORDER — IOHEXOL 350 MG/ML SOLN
75.0000 mL | Freq: Once | INTRAVENOUS | Status: AC | PRN
  Administered 2024-02-23: 75 mL via INTRAVENOUS

## 2024-02-23 MED ORDER — AMOXICILLIN-POT CLAVULANATE 875-125 MG PO TABS
1.0000 | ORAL_TABLET | Freq: Once | ORAL | Status: AC
  Administered 2024-02-23: 1 via ORAL
  Filled 2024-02-23: qty 1

## 2024-02-23 MED ORDER — AMOXICILLIN-POT CLAVULANATE 875-125 MG PO TABS: 1.0000 | ORAL_TABLET | Freq: Two times a day (BID) | ORAL | 0 refills | Status: AC

## 2024-02-23 NOTE — ED Triage Notes (Signed)
 Pt reports abdominal pain and n/v/d. Pt also reporting chills, but unsure of fever.

## 2024-02-23 NOTE — ED Provider Triage Note (Signed)
 Emergency Medicine Provider Triage Evaluation Note  Jesse Bush , a 28 y.o. male  was evaluated in triage.  Pt complains of nausea, vomiting, diarrhea, abdominal pain. Sx started yesterday. Generalized abdominal pain. No hx abdominal surgeries. Had appendicitis previously, but reports this was not treated surgically. No fevers but has had chills. Took Zofran  this morning without improvement  Review of Systems  Positive:  Negative:   Physical Exam  BP 131/87   Pulse (!) 54   Temp 98.3 F (36.8 C)   Resp 14   Ht 5' 10 (1.778 m)   Wt 97.2 kg   SpO2 99%   BMI 30.75 kg/m  Gen:   Awake, no distress   Resp:  Normal effort  MSK:   Moves extremities without difficulty  Other:    Medical Decision Making  Medically screening exam initiated at 5:11 PM.  Appropriate orders placed.  Celso Granja was informed that the remainder of the evaluation will be completed by another provider, this initial triage assessment does not replace that evaluation, and the importance of remaining in the ED until their evaluation is complete.     Nora Lauraine LABOR, PA-C 02/23/24 1712

## 2024-02-23 NOTE — Discharge Instructions (Addendum)
 Your history, exam, workup today were concerning for possible appendicitis again.  You had a conversation with general surgery and elected to go with oral antibiotics and symptom management as an outpatient and follow-up with them in clinic.  Please do so.  Please rest and stay hydrated.  If any symptoms change or worsen acutely, please return to the nearest emergency department.

## 2024-02-23 NOTE — Consult Note (Signed)
 Consulting Physician: Deward PARAS Eldean Klatt  Referring Provider: Dr. Ginger  Chief Complaint: Abdominal pain  Reason for Consult: Possible appendicitis   Subjective   HPI: Jesse Bush is an 28 y.o. male who is here for abdominal pain, nausea and vomiting.  Pain started today, periumbilical, then in the right lower quadrant later in the ER.  He had a lot of vomiting.  He was brought to the ER by his spouse.    Past Medical History:  Diagnosis Date   Depression    not clinically diagnosed but has assumed based off of family history- mom and grandad    Past Surgical History:  Procedure Laterality Date   WISDOM TOOTH EXTRACTION     no sedation    Family History  Problem Relation Age of Onset   Depression Mother    Rheum arthritis Mother    Other Father        does not know his medical history   Autism Sister    Depression Maternal Grandfather    Cancer Maternal Grandfather        bladder   Other Paternal Grandmother        covid in 2021   Healthy Paternal Grandfather        age 41 in 2022    Social:  reports that he has never smoked. He has never used smokeless tobacco. He reports that he does not currently use alcohol. He reports current drug use. Drug: Marijuana.  Allergies: No Known Allergies  Medications: Current Outpatient Medications  Medication Instructions   acetaminophen  (TYLENOL ) 650 mg, Oral, Every 6 hours PRN   hydrocortisone  (ANUSOL -HC) 25 mg, Rectal, 2 times daily    ROS - all of the below systems have been reviewed with the patient and positives are indicated with bold text General: chills, fever or night sweats Eyes: blurry vision or double vision ENT: epistaxis or sore throat Allergy/Immunology: itchy/watery eyes or nasal congestion Hematologic/Lymphatic: bleeding problems, blood clots or swollen lymph nodes Endocrine: temperature intolerance or unexpected weight changes Breast: new or changing breast lumps or nipple discharge Resp:  cough, shortness of breath, or wheezing CV: chest pain or dyspnea on exertion GI: as per HPI GU: dysuria, trouble voiding, or hematuria MSK: joint pain or joint stiffness Neuro: TIA or stroke symptoms Derm: pruritus and skin lesion changes Psych: anxiety and depression  Objective   PE Blood pressure 126/68, pulse (!) 54, temperature 98.3 F (36.8 C), resp. rate 16, height 5' 10 (1.778 m), weight 97.2 kg, SpO2 100%. Constitutional: NAD; conversant; no deformities Eyes: Moist conjunctiva; no lid lag; anicteric; PERRL Neck: Trachea midline; no thyromegaly Lungs: Normal respiratory effort; no tactile fremitus CV: RRR; no palpable thrills; no pitting edema GI: Abd Soft, nontender; no palpable hepatosplenomegaly MSK: Normal range of motion of extremities; no clubbing/cyanosis Psychiatric: Appropriate affect; alert and oriented x3 Lymphatic: No palpable cervical or axillary lymphadenopathy  Results for orders placed or performed during the hospital encounter of 02/23/24 (from the past 24 hours)  CBC with Diff     Status: Abnormal   Collection Time: 02/23/24  5:25 PM  Result Value Ref Range   WBC 23.7 (H) 4.0 - 10.5 K/uL   RBC 5.78 4.22 - 5.81 MIL/uL   Hemoglobin 17.6 (H) 13.0 - 17.0 g/dL   HCT 51.2 60.9 - 47.9 %   MCV 84.3 80.0 - 100.0 fL   MCH 30.4 26.0 - 34.0 pg   MCHC 36.1 (H) 30.0 - 36.0 g/dL   RDW 11.9  11.5 - 15.5 %   Platelets 461 (H) 150 - 400 K/uL   nRBC 0.0 0.0 - 0.2 %   Neutrophils Relative % 92 %   Neutro Abs 21.8 (H) 1.7 - 7.7 K/uL   Lymphocytes Relative 4 %   Lymphs Abs 1.0 0.7 - 4.0 K/uL   Monocytes Relative 3 %   Monocytes Absolute 0.7 0.1 - 1.0 K/uL   Eosinophils Relative 0 %   Eosinophils Absolute 0.0 0.0 - 0.5 K/uL   Basophils Relative 0 %   Basophils Absolute 0.1 0.0 - 0.1 K/uL   Immature Granulocytes 1 %   Abs Immature Granulocytes 0.16 (H) 0.00 - 0.07 K/uL  Urinalysis, Routine w reflex microscopic -Urine, Clean Catch     Status: Abnormal   Collection  Time: 02/23/24  5:35 PM  Result Value Ref Range   Color, Urine YELLOW YELLOW   APPearance HAZY (A) CLEAR   Specific Gravity, Urine 1.030 1.005 - 1.030   pH 6.0 5.0 - 8.0   Glucose, UA NEGATIVE NEGATIVE mg/dL   Hgb urine dipstick NEGATIVE NEGATIVE   Bilirubin Urine NEGATIVE NEGATIVE   Ketones, ur 80 (A) NEGATIVE mg/dL   Protein, ur 899 (A) NEGATIVE mg/dL   Nitrite NEGATIVE NEGATIVE   Leukocytes,Ua NEGATIVE NEGATIVE   RBC / HPF 0-5 0 - 5 RBC/hpf   WBC, UA 0-5 0 - 5 WBC/hpf   Bacteria, UA RARE (A) NONE SEEN   Squamous Epithelial / HPF 0-5 0 - 5 /HPF   Mucus PRESENT   Comprehensive metabolic panel with GFR     Status: Abnormal   Collection Time: 02/23/24  7:54 PM  Result Value Ref Range   Sodium 138 135 - 145 mmol/L   Potassium 4.3 3.5 - 5.1 mmol/L   Chloride 101 98 - 111 mmol/L   CO2 26 22 - 32 mmol/L   Glucose, Bld 72 70 - 99 mg/dL   BUN 11 6 - 20 mg/dL   Creatinine, Ser 8.95 0.61 - 1.24 mg/dL   Calcium 9.3 8.9 - 89.6 mg/dL   Total Protein 8.3 (H) 6.5 - 8.1 g/dL   Albumin 4.5 3.5 - 5.0 g/dL   AST 20 15 - 41 U/L   ALT 27 0 - 44 U/L   Alkaline Phosphatase 73 38 - 126 U/L   Total Bilirubin 1.6 (H) 0.0 - 1.2 mg/dL   GFR, Estimated >39 >39 mL/min   Anion gap 11 5 - 15  Lipase, blood     Status: None   Collection Time: 02/23/24  7:54 PM  Result Value Ref Range   Lipase 27 11 - 51 U/L  I-Stat CG4 Lactic Acid     Status: None   Collection Time: 02/23/24  8:02 PM  Result Value Ref Range   Lactic Acid, Venous 1.2 0.5 - 1.9 mmol/L     Imaging Orders         CT ABDOMEN PELVIS W CONTRAST      Assessment and Plan   Jesse Bush is an 28 y.o. male with abdominal pain, nausea and vomiting.  I think it is unlikely he has appendicitis, but he does have an abnormal appearance to his appendix.  We discussed surgery vs. antibiotic therapy for appendicitis.  We discussed risks, benefits and alternatives.  Since we are unsure whether the appendix is actually inflamed, he would like to  proceed with antibiotic therapy and feels up to leaving from the ER.  Discussed with Dr. Ginger.   Deward JINNY Foy, MD  [facility] Use [email or web address] to contact on call provider  New Patient Billing: [phone] - High MDM

## 2024-02-23 NOTE — ED Provider Notes (Signed)
 [place] [facility] AT [facility] Provider Note   CSN: [id number] Arrival date & time: 02/23/24  1650     Patient presents with: Abdominal Pain   Jesse Bush is a 28 y.o. male.   The history is provided by medical records and the patient. No language interpreter was used.  Abdominal Pain Pain location:  RLQ Pain quality: aching and cramping   Pain radiates to:  Does not radiate Pain severity:  Moderate Onset quality:  Gradual Duration:  2 days Progression:  Waxing and waning Chronicity:  Recurrent Context: not trauma   Relieved by:  Nothing Worsened by:  Nothing Ineffective treatments:  None tried Associated symptoms: chills, nausea and vomiting   Associated symptoms: no chest pain, no constipation, no cough, no diarrhea, no dysuria, no fatigue, no fever and no shortness of breath        Prior to Admission medications   Medication Sig Start Date End Date Taking? Authorizing Provider  acetaminophen  (TYLENOL ) 325 MG tablet Take 650 mg by mouth every 6 (six) hours as needed for mild pain or headache.    [provider]  hydrocortisone  (ANUSOL -HC) 25 MG suppository Place 1 suppository (25 mg total) rectally 2 (two) times daily. 05/15/22   Kennyth Worth HERO, MD    Allergies: Patient has no known allergies.    Review of Systems  Constitutional:  Positive for chills. Negative for diaphoresis, fatigue and fever.  HENT:  Negative for congestion.   Respiratory:  Negative for cough, chest tightness and shortness of breath.   Cardiovascular:  Negative for chest pain.  Gastrointestinal:  Positive for abdominal pain, nausea and vomiting. Negative for constipation and diarrhea.  Genitourinary:  Negative for dysuria, flank pain and frequency.  Musculoskeletal:  Negative for back pain and neck pain.  Skin:  Negative for rash and wound.  Neurological:  Negative for light-headedness, numbness and headaches.  Psychiatric/Behavioral:  Negative for  agitation and confusion.   All other systems reviewed and are negative.   Updated Vital Signs BP 131/87   Pulse (!) 54   Temp 98.3 F (36.8 C)   Resp 14   Ht 5' 10 (1.778 m)   Wt 97.2 kg   SpO2 99%   BMI 30.75 kg/m   Physical Exam Vitals and nursing note reviewed.  Constitutional:      General: He is not in acute distress.    Appearance: He is well-developed. He is not ill-appearing, toxic-appearing or diaphoretic.  HENT:     Head: Normocephalic and atraumatic.  Eyes:     Extraocular Movements: Extraocular movements intact.     Conjunctiva/sclera: Conjunctivae normal.  Cardiovascular:     Rate and Rhythm: Normal rate and regular rhythm.     Heart sounds: No murmur heard. Pulmonary:     Effort: Pulmonary effort is normal. No respiratory distress.     Breath sounds: Normal breath sounds.  Abdominal:     General: Abdomen is flat. Bowel sounds are normal.     Palpations: Abdomen is soft.     Tenderness: There is abdominal tenderness in the right lower quadrant and periumbilical area. There is no right CVA tenderness, left CVA tenderness, guarding or rebound.  Musculoskeletal:        General: No swelling.     Cervical back: Neck supple.  Skin:    General: Skin is warm and dry.     Capillary Refill: Capillary refill takes less than 2 seconds.     Coloration: Skin is not  pale.     Findings: No rash.  Neurological:     General: No focal deficit present.     Mental Status: He is alert.  Psychiatric:        Mood and Affect: Mood normal.     (all labs ordered are listed, but only abnormal results are displayed) Labs Reviewed  CBC WITH DIFFERENTIAL/PLATELET - Abnormal; Notable for the following components:      Result Value   WBC 23.7 (*)    Hemoglobin 17.6 (*)    MCHC 36.1 (*)    Platelets 461 (*)    Neutro Abs 21.8 (*)    Abs Immature Granulocytes 0.16 (*)    All other components within normal limits  URINALYSIS, ROUTINE W REFLEX MICROSCOPIC - Abnormal; Notable  for the following components:   APPearance HAZY (*)    Ketones, ur 80 (*)    Protein, ur 100 (*)    Bacteria, UA RARE (*)    All other components within normal limits  COMPREHENSIVE METABOLIC PANEL WITH GFR - Abnormal; Notable for the following components:   Total Protein 8.3 (*)    Total Bilirubin 1.6 (*)    All other components within normal limits  LIPASE, BLOOD  I-STAT CG4 LACTIC ACID, ED  I-STAT CG4 LACTIC ACID, ED    EKG: None  Radiology: CT ABDOMEN PELVIS W CONTRAST Result Date: 02/23/2024 EXAM: CT ABDOMEN AND PELVIS WITH CONTRAST 02/23/2024 08:56:58 PM TECHNIQUE: CT of the abdomen and pelvis was performed with the administration of 75 mL of iohexol  (OMNIPAQUE ) 350 MG/ML injection. Multiplanar reformatted images are provided for review. Automated exposure control, iterative reconstruction, and/or weight-based adjustment of the mA/kV was utilized to reduce the radiation dose to as low as reasonably achievable. COMPARISON: 03/19/2022 CLINICAL HISTORY: Abdominal pain, acute, nonlocalized; Concern for possible recurrent appendicitis with tenderness mainly in the right lower quadrant but also diffusely. Leukocytosis. Nausea vomiting and chills. FINDINGS: LOWER CHEST: No acute abnormality. LIVER: The liver is unremarkable. GALLBLADDER AND BILE DUCTS: Gallbladder is unremarkable. No biliary ductal dilatation. SPLEEN: No acute abnormality. PANCREAS: No acute abnormality. ADRENAL GLANDS: No acute abnormality. KIDNEYS, URETERS AND BLADDER: No stones in the kidneys or ureters. No hydronephrosis. No perinephric or periureteral stranding. Urinary bladder is unremarkable. GI AND BOWEL: Stomach demonstrates no acute abnormality. There is no bowel obstruction. PERITONEUM AND RETROPERITONEUM: No ascites. No free air. VASCULATURE: Aorta is normal in caliber. LYMPH NODES: No lymphadenopathy. REPRODUCTIVE ORGANS: No acute abnormality. BONES AND SOFT TISSUES: No acute osseous abnormality. No focal soft tissue  abnormality. APPENDIX: The appendix measures 7 mm in diameter. No surrounding inflammation. No change since prior study. IMPRESSION: 1. Stable appearance of the appendix . No surrounding inflammation. No convincing evidence for appendicitis. Electronically signed by: Franky Crease MD 02/23/2024 09:06 PM EST RP Workstation: [id number]     Procedures   Medications Ordered in the ED  ondansetron  (ZOFRAN -ODT) disintegrating tablet 4 mg (4 mg Oral Given 02/23/24 1715)  sodium chloride  0.9 % bolus 1,000 mL (0 mLs Intravenous Stopped 02/23/24 2230)  iohexol  (OMNIPAQUE ) 350 MG/ML injection 75 mL (75 mLs Intravenous Contrast Given 02/23/24 2057)  amoxicillin -clavulanate (AUGMENTIN ) 875-125 MG per tablet 1 tablet (1 tablet Oral Given 02/23/24 2227)                                    Medical Decision Making Amount and/or Complexity of Data Reviewed Radiology: ordered.  Risk Prescription drug  management.    Jesse Bush is a 28 y.o. male with a past medical history significant for previous possible appendicitis who presents with chills, nausea, vomiting, diarrhea, and abdominal discomfort.  According to patient, year ago he had pain in his abdomen and his CT showed possible appendicitis.  Patient was treated without surgery and did well and had not had issues with it since.  He reports that yesterday he started having chills and started having nausea and vomiting and diarrhea.  He then started having worsening pain diffusely in his abdomen but worse in the right lower quadrant.  He said it is not in his back and he denies any urinary changes.  He reports no blood in his stool or emesis.  It was mainly water.  Denies any trauma.  Denies any chest pain, shortness of breath, or cough.  Denies any testicle pain or swelling.  Denies any rashes or pain in his extremities.  He reports the pain was moderate to severe initially but has improved with some nausea medicine in triage.  On my exam, he does have dry  mucous membranes.  Chest is nontender and he did not have a murmur.  Abdomen was tender primarily right lower quadrant but I did hear bowel sounds.  Back and flanks nontender.  No rash seen.  He deferred GU exam given his lack of any groin symptoms.  Good pulses in extremities.  Patient otherwise resting.  Given his history of possible appendicitis and his tenderness in right lower quadrant I am concerned about appendicitis again.  We will get CT scan and see the labs from triage.  Anticipate disposition based on findings.  CT scan appeared similar with a 7 mm appendix but no clear appendicitis.  Due to his location of pain, white count, and previous near appendicitis, I spoke to general surgery who was gena come see the patient and lay hands.  General surgery felt on him and he did not have significant pain now.  They offered to admit him for appendectomy but patient would rather go home and follow-up as an outpatient.  They recommended Augmentin  orally and will give him a dose and we will give him a prescription.  Will also give prescription Zofran .  He did not want pain medicine at this time.  Patient be discharged for outpatient general surgery and PCP follow-up.  He agreed with plan of care and return precautions.  You no other questions or concerns and was discharged in stable condition.     Final diagnoses:  Right lower quadrant abdominal pain  Nausea and vomiting, unspecified vomiting type    ED Discharge Orders          Ordered    amoxicillin -clavulanate (AUGMENTIN ) 875-125 MG tablet  Every 12 hours        02/23/24 2218    ondansetron  (ZOFRAN -ODT) 4 MG disintegrating tablet  Every 8 hours PRN        02/23/24 2218           Clinical Impression: 1. Right lower quadrant abdominal pain   2. Nausea and vomiting, unspecified vomiting type     Disposition: Discharge  Condition: Good  I have discussed the results, Dx and Tx plan with the pt(& family if present).  He/she/they expressed understanding and agree(s) with the plan. Discharge instructions discussed at great length. Strict return precautions discussed and pt &/or family have verbalized understanding of the instructions. No further questions at time of discharge.    Discharge Medication List  as of 02/23/2024 10:20 PM     START taking these medications   Details  amoxicillin -clavulanate (AUGMENTIN ) 875-125 MG tablet Take 1 tablet by mouth every 12 (twelve) hours., Starting Sun 02/23/2024, Print    ondansetron  (ZOFRAN -ODT) 4 MG disintegrating tablet Take 1 tablet (4 mg total) by mouth every 8 (eight) hours as needed for nausea or vomiting., Starting Sun 02/23/2024, Print        Follow Up: Surgery, [facility] [address] [place] [address] [phone]     Katrinka Garnette KIDD, MD [address] [place] [address] [phone]         Ziyan Schoon, Lonni PARAS, MD 02/23/24 2252
